# Patient Record
Sex: Female | Born: 1955
Health system: Southern US, Community
[De-identification: ages and names within clinical notes are randomized; demographics above are authoritative.]

## PROBLEM LIST (undated history)

## (undated) DIAGNOSIS — J189 Pneumonia, unspecified organism: Secondary | ICD-10-CM

## (undated) DIAGNOSIS — F32A Depression, unspecified: Secondary | ICD-10-CM

## (undated) DIAGNOSIS — D649 Anemia, unspecified: Secondary | ICD-10-CM

## (undated) DIAGNOSIS — M51369 Other intervertebral disc degeneration, lumbar region without mention of lumbar back pain or lower extremity pain: Secondary | ICD-10-CM

## (undated) DIAGNOSIS — M797 Fibromyalgia: Secondary | ICD-10-CM

## (undated) DIAGNOSIS — J309 Allergic rhinitis, unspecified: Secondary | ICD-10-CM

## (undated) DIAGNOSIS — K219 Gastro-esophageal reflux disease without esophagitis: Secondary | ICD-10-CM

## (undated) DIAGNOSIS — M5136 Other intervertebral disc degeneration, lumbar region: Secondary | ICD-10-CM

## (undated) DIAGNOSIS — F419 Anxiety disorder, unspecified: Secondary | ICD-10-CM

## (undated) DIAGNOSIS — K2960 Other gastritis without bleeding: Secondary | ICD-10-CM

## (undated) DIAGNOSIS — M1990 Unspecified osteoarthritis, unspecified site: Secondary | ICD-10-CM

## (undated) HISTORY — DX: Other gastritis without bleeding: K29.60

## (undated) HISTORY — DX: Anemia, unspecified: D64.9

## (undated) HISTORY — PX: BREAST SURGERY: SHX581

## (undated) HISTORY — PX: TONSILLECTOMY: SHX5217

## (undated) HISTORY — DX: Fibromyalgia: M79.7

## (undated) HISTORY — DX: Other intervertebral disc degeneration, lumbar region without mention of lumbar back pain or lower extremity pain: M51.369

## (undated) HISTORY — PX: TUBAL LIGATION: SHX77

## (undated) HISTORY — PX: KNEE ARTHROSCOPY: SUR90

## (undated) HISTORY — DX: Anxiety disorder, unspecified: F41.9

## (undated) HISTORY — PX: EYE SURGERY: SHX253

## (undated) HISTORY — DX: Unspecified osteoarthritis, unspecified site: M19.90

## (undated) HISTORY — DX: Other intervertebral disc degeneration, lumbar region: M51.36

## (undated) HISTORY — DX: Allergic rhinitis, unspecified: J30.9

---

## 1998-10-03 ENCOUNTER — Ambulatory Visit (HOSPITAL_COMMUNITY): Admission: EM | Admit: 1998-10-03 | Discharge: 1998-10-03 | Payer: Self-pay | Admitting: Emergency Medicine

## 1998-10-03 ENCOUNTER — Encounter: Payer: Self-pay | Admitting: Cardiology

## 1999-10-04 ENCOUNTER — Other Ambulatory Visit: Admission: RE | Admit: 1999-10-04 | Discharge: 1999-10-04 | Payer: Self-pay | Admitting: Obstetrics & Gynecology

## 2000-03-12 ENCOUNTER — Other Ambulatory Visit: Admission: RE | Admit: 2000-03-12 | Discharge: 2000-03-12 | Payer: Self-pay | Admitting: Obstetrics & Gynecology

## 2000-11-03 ENCOUNTER — Encounter: Admission: RE | Admit: 2000-11-03 | Discharge: 2000-11-03 | Payer: Self-pay | Admitting: Otolaryngology

## 2000-11-03 ENCOUNTER — Encounter: Payer: Self-pay | Admitting: Otolaryngology

## 2001-06-24 ENCOUNTER — Other Ambulatory Visit: Admission: RE | Admit: 2001-06-24 | Discharge: 2001-06-24 | Payer: Self-pay | Admitting: Obstetrics & Gynecology

## 2002-12-13 ENCOUNTER — Other Ambulatory Visit: Admission: RE | Admit: 2002-12-13 | Discharge: 2002-12-13 | Payer: Self-pay | Admitting: Obstetrics & Gynecology

## 2003-07-29 ENCOUNTER — Encounter (INDEPENDENT_AMBULATORY_CARE_PROVIDER_SITE_OTHER): Payer: Self-pay | Admitting: Specialist

## 2003-07-29 ENCOUNTER — Ambulatory Visit (HOSPITAL_COMMUNITY): Admission: RE | Admit: 2003-07-29 | Discharge: 2003-07-29 | Payer: Self-pay | Admitting: Obstetrics & Gynecology

## 2004-01-10 ENCOUNTER — Ambulatory Visit: Payer: Self-pay | Admitting: Family Medicine

## 2004-01-31 ENCOUNTER — Other Ambulatory Visit: Admission: RE | Admit: 2004-01-31 | Discharge: 2004-01-31 | Payer: Self-pay | Admitting: Obstetrics & Gynecology

## 2004-02-20 ENCOUNTER — Ambulatory Visit: Payer: Self-pay | Admitting: Family Medicine

## 2004-03-19 ENCOUNTER — Ambulatory Visit: Payer: Self-pay | Admitting: Family Medicine

## 2004-04-02 ENCOUNTER — Ambulatory Visit: Payer: Self-pay | Admitting: Family Medicine

## 2004-05-01 ENCOUNTER — Ambulatory Visit: Payer: Self-pay | Admitting: Family Medicine

## 2004-07-02 ENCOUNTER — Ambulatory Visit: Payer: Self-pay | Admitting: Family Medicine

## 2004-07-19 ENCOUNTER — Ambulatory Visit: Payer: Self-pay | Admitting: Family Medicine

## 2004-09-19 ENCOUNTER — Ambulatory Visit: Payer: Self-pay | Admitting: Family Medicine

## 2005-01-08 ENCOUNTER — Other Ambulatory Visit: Admission: RE | Admit: 2005-01-08 | Discharge: 2005-01-08 | Payer: Self-pay | Admitting: Obstetrics and Gynecology

## 2005-01-22 ENCOUNTER — Ambulatory Visit: Payer: Self-pay | Admitting: Family Medicine

## 2005-04-16 ENCOUNTER — Ambulatory Visit: Payer: Self-pay | Admitting: Family Medicine

## 2005-05-02 ENCOUNTER — Ambulatory Visit: Payer: Self-pay | Admitting: Family Medicine

## 2006-01-10 ENCOUNTER — Other Ambulatory Visit: Admission: RE | Admit: 2006-01-10 | Discharge: 2006-01-10 | Payer: Self-pay | Admitting: Obstetrics and Gynecology

## 2006-02-04 ENCOUNTER — Ambulatory Visit (HOSPITAL_COMMUNITY): Admission: RE | Admit: 2006-02-04 | Discharge: 2006-02-04 | Payer: Self-pay | Admitting: Internal Medicine

## 2008-01-06 ENCOUNTER — Telehealth: Payer: Self-pay | Admitting: Gastroenterology

## 2008-01-13 ENCOUNTER — Ambulatory Visit: Payer: Self-pay | Admitting: Gastroenterology

## 2008-01-20 ENCOUNTER — Encounter: Payer: Self-pay | Admitting: Gastroenterology

## 2008-01-20 ENCOUNTER — Ambulatory Visit: Payer: Self-pay | Admitting: Gastroenterology

## 2008-01-23 ENCOUNTER — Encounter: Payer: Self-pay | Admitting: Gastroenterology

## 2008-10-17 ENCOUNTER — Ambulatory Visit: Admission: RE | Admit: 2008-10-17 | Discharge: 2008-10-17 | Payer: Self-pay | Admitting: Obstetrics and Gynecology

## 2008-10-17 ENCOUNTER — Ambulatory Visit: Payer: Self-pay | Admitting: *Deleted

## 2008-10-17 ENCOUNTER — Encounter (INDEPENDENT_AMBULATORY_CARE_PROVIDER_SITE_OTHER): Payer: Self-pay | Admitting: Obstetrics and Gynecology

## 2009-05-29 ENCOUNTER — Ambulatory Visit: Payer: Self-pay | Admitting: Surgery

## 2010-07-24 NOTE — Assessment & Plan Note (Signed)
OFFICE VISIT   Amy Spence, Amy Spence  DOB:  09-09-1955                                       05/29/2009  EAVWU#:[id number]   REASON FOR VISIT:  Bilateral leg pain.   REFERRING PHYSICIAN:  Dr. Thomasena Edis.   PRIMARY CARE PHYSICIAN:  Dr. Nehemiah Settle.   HISTORY:  This is a 55 year old female seen at request of Dr. Thomasena Edis  for evaluation of bilateral leg pain.  She states that she has pain,  which is a shooting-type pain and some numbness on the anterior parts of  both feet.  There are no aggravating factors.  It can come on at any  time.  The only relieving factor is a hot bath.  It is not associated  with exercise.  She does not have a history of deep vein thrombosis.  She does not endorse any swelling in her legs.   PAST MEDICAL HISTORY:  Fibromyalgia.   PAST SURGICAL HISTORY:  Tubal ligation, right knee scope, and  tonsillectomy.   REVIEW OF SYSTEMS:  GI:  Positive for reflux and constipation.  VASCULAR:  Positive for pain in the legs with walking.  NEURO:  Positive for dizziness.  MUSCULOSKELETAL:  Positive for arthritis, joint pain, muscle pain.  PSYCH:  Positive for depression, anxiety.  All other review of systems  are negative as documented in the encounter form.   FAMILY HISTORY:  Positive for cardiovascular disease at an early age in  her mother and her sister.   SOCIAL HISTORY:  She is married with 3 children.  She is a Futures trader.  Does not smoke, has a history smoking, does not drink alcohol.   PHYSICAL EXAMINATION:  Heart rate 92, blood pressure 123/76, O2 sats are  100%.  General:  She is well-appearing in no distress.  HEENT:  Within  normal limits.  Respirations:  Nonlabored.  Cardiovascular:  Regular  rate and rhythm.  Musculoskeletal is without major deformities.  Neuro:  She has no focal deficits or weakness.  Skin is without rash.  She has  palpable posterior tibial pulses and popliteal pulses bilaterally.  There is no edema.  No  ulceration.   DIAGNOSTIC STUDIES:  Venous ultrasound was performed.  This shows no  evidence of deep or superficial venous thrombosis.  There is venous  reflux exclusively in her proximal greater saphenous vein.  Posterior  tibial artery waveforms are triphasic.   ASSESSMENT:  Bilateral leg pain.   PLAN:  I discussed the ultrasound findings with the patient.  I do not  feel that her symptoms are related to either arterial or venous  insufficiency.  The patient will contact me if she has any further  questions.  She is due to see Dr. Nehemiah Settle next week, and she is going to  see Dr. Thomasena Edis in the near future for a repeat steroid injection.     Jorge Ny, MD  Electronically Signed   VWB/MEDQ  D:  05/29/2009  T:  05/30/2009  Job:  [id number]   cc:   Dr. Deirdre Evener D. Polite, M.D.

## 2010-07-24 NOTE — Procedures (Signed)
DUPLEX DEEP VENOUS EXAM - LOWER EXTREMITY   INDICATION:  Fairly constant lower extremity pain bilaterally for  approximately 4-6 months.   HISTORY:  Edema:  No.  Trauma/Surgery:  No.  Pain:  Bilateral lower extremity at night and during day.  PE:  No.  Previous DVT:  No.  Anticoagulants:  No.  Other:   DUPLEX EXAM:                CFV   SFV   PopV  PTV    GSV                R  L  R  L  R  L  R   L  R  L  Thrombosis    o  o  o  o  o  o  o   o  o  o  Spontaneous   +  +  +  +  +  +  +   +  +  +  Phasic        +  +  +  +  +  +  +   +  +  +  Augmentation  +  +  +  +  +  +  +   +  +  +  Compressible  +  +  +  +  +  +  +   +  +  +  Competent     o  o  +  +  +  +  +   +  p  p   Legend:  + - yes  o - no  p - partial  D - decreased   IMPRESSION:  1. No evidence of deep venous thrombosis or superficial venous      thrombosis in bilateral lower extremities.  2. Bilateral common femoral veins show evidence of venous      insufficiency.  3. Bilateral proximal greater saphenous veins show evidence of reflux      with the rest within normal limits.  4. Incidental note:  Bilateral posterior tibial artery flow is      triphasic.    _____________________________  V. Charlena Cross, MD   AS/MEDQ  D:  05/29/2009  T:  05/29/2009  Job:  [id number]

## 2010-07-27 NOTE — Op Note (Signed)
NAME:  Amy Spence, Amy Spence                        ACCOUNT NO.:  [id number]   MEDICAL RECORD NO.:  [id number]                   PATIENT TYPE:  AMB   LOCATION:  SDC                                  FACILITY:  [facility]   PHYSICIAN:  Freddy Finner, M.D.                DATE OF BIRTH:  April 04, 1955   DATE OF PROCEDURE:  07/29/2003  DATE OF DISCHARGE:                                 OPERATIVE REPORT   PREOPERATIVE DIAGNOSES:  1. Uterine enlargement.  2. Endometrial polyp on sonohysterogram.  3. Menorrhagia.   POSTOPERATIVE DIAGNOSES:  1. Uterine enlargement.  2. Endometrial polyp on sonohysterogram.  3. Menorrhagia.   OPERATION:  Hysteroscopy, dilatation and curettage, resection of polyp, and  Novasure ablation of endometrium.   SURGEON:  Freddy Finner, M.D.   ANESTHESIA:  Managed intravenous sedation and paracervical block.   INTRAOPERATIVE COMPLICATIONS:  None.   SORBITOL DEFICIT:  20 mL.   ESTIMATED BLOOD LOSS:  10 mL.   INTRAOPERATIVE COMPLICATIONS:  The patient is a 55 year old with  menorrhagia, uterine enlargement, and an endometrial polyp identified by  sonohysterogram.  She is now admitted for surgery.   She was admitted on the morning of surgery, brought to the operating room,  and placed under adequate intravenous sedation and placed in a dorsal  lithotomy position in the [place] stirrup system.  Betadine prep of mons,  perineum, and vagina was carried out in the usual fashion.  Sterile drapes  were applied.  Bivalve speculum was introduced.  Cervix was visualized,  grasped with a single-tooth tenaculum on the anterior lip.  Paracervical  block was placed using a total of 10 mL of 1% Xylocaine.  The cervix sounded  4.5 cm, uterus to 10.5 cm.  A 12.5 degree A[facility]I hysteroscope was introduced  after the dilating the cervix to a #23 with [place].  A small polyp could be  identified.  Gentle thorough curettage was carried out with resection of the  polyp, and the specimen was  submitted for histologic examination.  Novasure  device was then applied in the usual fashion.  Uterine width was 4 cm.  The  carbon dioxide test was negative for perforation.  Ablation was carried out  for a total of 85 seconds.  The device was removed.  The patient was taken  to the recovery room in good condition.  She will be given routine  outpatient surgical instructions for follow-up in the office in  approximately one week.                                               Freddy Finner, M.D.    WRN/MEDQ  D:  07/29/2003  T:  07/29/2003  Job:  [id number]

## 2011-02-06 ENCOUNTER — Encounter: Payer: Self-pay | Admitting: Gastroenterology

## 2011-02-21 ENCOUNTER — Encounter: Payer: Self-pay | Admitting: Gastroenterology

## 2011-02-21 ENCOUNTER — Ambulatory Visit (INDEPENDENT_AMBULATORY_CARE_PROVIDER_SITE_OTHER): Payer: Federal, State, Local not specified - PPO | Admitting: Gastroenterology

## 2011-02-21 ENCOUNTER — Other Ambulatory Visit (INDEPENDENT_AMBULATORY_CARE_PROVIDER_SITE_OTHER): Payer: Federal, State, Local not specified - PPO

## 2011-02-21 VITALS — BP 100/68 | HR 76 | Ht 63.0 in | Wt 154.2 lb

## 2011-02-21 DIAGNOSIS — K5900 Constipation, unspecified: Secondary | ICD-10-CM | POA: Insufficient documentation

## 2011-02-21 DIAGNOSIS — R1033 Periumbilical pain: Secondary | ICD-10-CM

## 2011-02-21 LAB — CBC WITH DIFFERENTIAL/PLATELET
Basophils Absolute: 0 10*3/uL (ref 0.0–0.1)
Eosinophils Absolute: 0.1 10*3/uL (ref 0.0–0.7)
Hemoglobin: 13.4 g/dL (ref 12.0–15.0)
Lymphocytes Relative: 38 % (ref 12.0–46.0)
MCHC: 34.1 g/dL (ref 30.0–36.0)
Monocytes Relative: 5.5 % (ref 3.0–12.0)
Neutro Abs: 3.9 10*3/uL (ref 1.4–7.7)
Neutrophils Relative %: 54.7 % (ref 43.0–77.0)
RBC: 4.24 Mil/uL (ref 3.87–5.11)
RDW: 12.5 % (ref 11.5–14.6)

## 2011-02-21 LAB — BASIC METABOLIC PANEL
BUN: 20 mg/dL (ref 6–23)
Calcium: 9.5 mg/dL (ref 8.4–10.5)
GFR: 81.42 mL/min (ref >60.00)
Potassium: 4.3 mEq/L (ref 3.5–5.1)
Sodium: 141 mEq/L (ref 135–145)

## 2011-02-21 LAB — HEPATIC FUNCTION PANEL
ALT: 17 U/L (ref 0–35)
Albumin: 3.9 g/dL (ref 3.5–5.2)
Alkaline Phosphatase: 79 U/L (ref 39–117)
Total Protein: 7 g/dL (ref 6.0–8.3)

## 2011-02-21 MED ORDER — POLYETHYLENE GLYCOL 3350 17 GM/SCOOP PO POWD: ORAL | Status: DC

## 2011-02-21 NOTE — Progress Notes (Signed)
History of Present Illness: This is a 55 year old female who relates worsening problems with chronic constipation for the past few years. She underwent colonoscopy in November 2009 with one polyp removed that was not retrieved. She states her stools are small, hard and pellet-like and difficult to evacuate occasionally. She notes twinges of periumbilical pain. She tried MiraLax once daily for 2-3 weeks which only slightly improved her symptoms. She has had intentional weight loss on Weight Watchers diet. Denies diarrhea, change in stool caliber, melena, hematochezia, nausea, vomiting, dysphagia, reflux symptoms, chest pain.  Review of Systems: Pertinent positive and negative review of systems were noted in the above HPI section. All other review of systems were otherwise negative.  Current Medications, Allergies, Past Medical History, Past Surgical History, Family History and Social History were reviewed in [employer] record.  Physical Exam: General: Well developed , well nourished, no acute distress Head: Normocephalic and atraumatic Eyes:  sclerae anicteric, EOMI Ears: Normal auditory acuity Mouth: No deformity or lesions Neck: Supple, no masses or thyromegaly Lungs: Clear throughout to auscultation Heart: Regular rate and rhythm; no murmurs, rubs or bruits Abdomen: Soft, non tender and non distended. No masses, hepatosplenomegaly or hernias noted. Normal Bowel sounds Rectal: No lesions, hard, pellet-like, brown, Hemoccult negative stool in the vault Musculoskeletal: Symmetrical with no gross deformities  Skin: No lesions on visible extremities Pulses:  Normal pulses noted Extremities: No clubbing, cyanosis, edema or deformities noted Neurological: Alert oriented x 4, grossly nonfocal Cervical Nodes:  No significant cervical adenopathy Inguinal Nodes: No significant inguinal adenopathy Psychological:  Alert and cooperative. Normal mood and affect  Assessment and  Recommendations:  1. Constipation which has slowly worsened over time. Hemoccult-negative stool. Colonoscopy performed 3 years ago with only one small polyp. Obtain blood work. Substantially increase dietary fiber and water intake. MiraLax 2-3 times daily titrated for adequate bowel movement. Consider the addition of a stool softener. Return office visit in one month and if her symptoms have not adequately responded consider further evaluation with colonoscopy.

## 2011-02-21 NOTE — Patient Instructions (Addendum)
Go directly to the basement to have your labs drawn today. Start Miralax 17 grams in 8 oz of water 2-3 x daily. A prescription has been sent to your pharmacy.  High fiber diet and constipation instructions given. Increase your daily fluids.  cc: Renford Dills, MD

## 2011-02-22 ENCOUNTER — Encounter: Payer: Self-pay | Admitting: Gastroenterology

## 2011-02-22 ENCOUNTER — Telehealth: Payer: Self-pay | Admitting: Gastroenterology

## 2011-02-22 NOTE — Telephone Encounter (Signed)
Error

## 2011-02-22 NOTE — Telephone Encounter (Signed)
Patient wants to know what foods she can eat that are gluten free and high in fiber.  She is advised fresh fruits and vegetables.

## 2011-05-29 ENCOUNTER — Encounter: Payer: Self-pay | Admitting: Gastroenterology

## 2011-10-08 ENCOUNTER — Other Ambulatory Visit: Payer: Self-pay | Admitting: Orthopedic Surgery

## 2012-03-25 ENCOUNTER — Encounter: Payer: Self-pay | Admitting: Gastroenterology

## 2012-03-25 ENCOUNTER — Ambulatory Visit (INDEPENDENT_AMBULATORY_CARE_PROVIDER_SITE_OTHER): Payer: Federal, State, Local not specified - PPO | Admitting: Gastroenterology

## 2012-03-25 VITALS — BP 122/60 | HR 88 | Ht 63.0 in | Wt 167.0 lb

## 2012-03-25 DIAGNOSIS — K5900 Constipation, unspecified: Secondary | ICD-10-CM

## 2012-03-25 NOTE — Progress Notes (Signed)
History of Present Illness: This is a 57 year old female who relates ongoing difficulties with constipation. Describes bowel movements every few days that are hard and pellet like occasional lower abdominal discomfort. She states she tried MiraLax recommended after her last office visit with me one year ago however MiraLax was not effective. She is occasionally tried a stool softener which makes her stools softer but is not increase the frequency of bowel movements. She underwent colonoscopy 2009 that was unremarkable.   Current Medications, Allergies, Past Medical History, Past Surgical History, Family History and Social History were reviewed in [employer] record.  Physical Exam: General: Well developed , well nourished, no acute distress Head: Normocephalic and atraumatic Eyes:  sclerae anicteric, EOMI Ears: Normal auditory acuity Mouth: No deformity or lesions Lungs: Clear throughout to auscultation Heart: Regular rate and rhythm; no murmurs, rubs or bruits Abdomen: Soft, non tender and non distended. No masses, hepatosplenomegaly or hernias noted. Normal Bowel sounds Musculoskeletal: Symmetrical with no gross deformities  Pulses:  Normal pulses noted Extremities: No clubbing, cyanosis, edema or deformities noted Neurological: Alert oriented x 4, grossly nonfocal Psychological:  Alert and cooperative. Normal mood and affect  Assessment and Recommendations:  1. Constipation. Increase dietary fiber and water intake. Begin a fiber supplement twice daily and milk of magnesia once or twice daily. If she is having adequate bowel movements she may change the milk of magnesia to once or twice daily as needed. Continue fiber supplements long-term. Contact my office if her constipation is not adequately controlled with this regimen within the next few weeks.

## 2012-03-25 NOTE — Patient Instructions (Addendum)
Start a over the counter fiber supplement such as Benefiber or Citrucel Clear twice daily.  Take Milk of Magnesia 1-2 x daily for constipation.  Call back in 2 weeks if your symptoms have not improved.

## 2012-10-29 ENCOUNTER — Encounter: Payer: Self-pay | Admitting: Gastroenterology

## 2013-04-19 ENCOUNTER — Other Ambulatory Visit: Payer: Self-pay | Admitting: Obstetrics and Gynecology

## 2013-04-19 DIAGNOSIS — R928 Other abnormal and inconclusive findings on diagnostic imaging of breast: Secondary | ICD-10-CM

## 2013-04-28 ENCOUNTER — Other Ambulatory Visit: Payer: Self-pay | Admitting: Obstetrics and Gynecology

## 2013-04-28 ENCOUNTER — Ambulatory Visit
Admission: RE | Admit: 2013-04-28 | Discharge: 2013-04-28 | Disposition: A | Discharge status: Home / Self Care | Payer: Federal, State, Local not specified - PPO | Source: Ambulatory Visit | Attending: Obstetrics and Gynecology | Admitting: Obstetrics and Gynecology

## 2013-04-28 DIAGNOSIS — R928 Other abnormal and inconclusive findings on diagnostic imaging of breast: Secondary | ICD-10-CM

## 2013-04-29 ENCOUNTER — Other Ambulatory Visit: Payer: Self-pay | Admitting: Obstetrics and Gynecology

## 2013-04-29 ENCOUNTER — Ambulatory Visit
Admission: RE | Admit: 2013-04-29 | Discharge: 2013-04-29 | Disposition: A | Discharge status: Home / Self Care | Payer: Federal, State, Local not specified - PPO | Source: Ambulatory Visit | Attending: Obstetrics and Gynecology | Admitting: Obstetrics and Gynecology

## 2013-04-29 DIAGNOSIS — R928 Other abnormal and inconclusive findings on diagnostic imaging of breast: Secondary | ICD-10-CM

## 2013-08-07 ENCOUNTER — Encounter: Payer: Self-pay | Admitting: Gastroenterology

## 2013-11-06 ENCOUNTER — Encounter: Payer: Self-pay | Admitting: Gastroenterology

## 2017-08-09 DIAGNOSIS — R300 Dysuria: Secondary | ICD-10-CM | POA: Diagnosis not present

## 2017-08-09 DIAGNOSIS — N3090 Cystitis, unspecified without hematuria: Secondary | ICD-10-CM | POA: Diagnosis not present

## 2017-08-17 DIAGNOSIS — N3090 Cystitis, unspecified without hematuria: Secondary | ICD-10-CM | POA: Diagnosis not present

## 2017-09-22 DIAGNOSIS — S93422D Sprain of deltoid ligament of left ankle, subsequent encounter: Secondary | ICD-10-CM | POA: Diagnosis not present

## 2017-09-25 DIAGNOSIS — M797 Fibromyalgia: Secondary | ICD-10-CM | POA: Diagnosis not present

## 2017-09-25 DIAGNOSIS — Z79899 Other long term (current) drug therapy: Secondary | ICD-10-CM | POA: Diagnosis not present

## 2017-09-25 DIAGNOSIS — F329 Major depressive disorder, single episode, unspecified: Secondary | ICD-10-CM | POA: Diagnosis not present

## 2017-09-25 DIAGNOSIS — M961 Postlaminectomy syndrome, not elsewhere classified: Secondary | ICD-10-CM | POA: Diagnosis not present

## 2017-09-25 DIAGNOSIS — F411 Generalized anxiety disorder: Secondary | ICD-10-CM | POA: Diagnosis not present

## 2017-09-25 DIAGNOSIS — M47816 Spondylosis without myelopathy or radiculopathy, lumbar region: Secondary | ICD-10-CM | POA: Diagnosis not present

## 2017-09-25 DIAGNOSIS — M5416 Radiculopathy, lumbar region: Secondary | ICD-10-CM | POA: Diagnosis not present

## 2017-09-26 DIAGNOSIS — L659 Nonscarring hair loss, unspecified: Secondary | ICD-10-CM | POA: Diagnosis not present

## 2017-09-26 DIAGNOSIS — F988 Other specified behavioral and emotional disorders with onset usually occurring in childhood and adolescence: Secondary | ICD-10-CM | POA: Diagnosis not present

## 2017-09-26 DIAGNOSIS — M797 Fibromyalgia: Secondary | ICD-10-CM | POA: Diagnosis not present

## 2017-09-26 DIAGNOSIS — F419 Anxiety disorder, unspecified: Secondary | ICD-10-CM | POA: Diagnosis not present

## 2017-09-26 DIAGNOSIS — F322 Major depressive disorder, single episode, severe without psychotic features: Secondary | ICD-10-CM | POA: Diagnosis not present

## 2017-10-13 DIAGNOSIS — Z0110 Encounter for examination of ears and hearing without abnormal findings: Secondary | ICD-10-CM | POA: Diagnosis not present

## 2017-10-13 DIAGNOSIS — J309 Allergic rhinitis, unspecified: Secondary | ICD-10-CM | POA: Diagnosis not present

## 2017-10-13 DIAGNOSIS — H6983 Other specified disorders of Eustachian tube, bilateral: Secondary | ICD-10-CM | POA: Diagnosis not present

## 2017-10-20 DIAGNOSIS — J3089 Other allergic rhinitis: Secondary | ICD-10-CM | POA: Diagnosis not present

## 2017-10-20 DIAGNOSIS — Z6831 Body mass index (BMI) 31.0-31.9, adult: Secondary | ICD-10-CM | POA: Diagnosis not present

## 2017-10-20 DIAGNOSIS — R635 Abnormal weight gain: Secondary | ICD-10-CM | POA: Diagnosis not present

## 2017-10-20 DIAGNOSIS — E669 Obesity, unspecified: Secondary | ICD-10-CM | POA: Diagnosis not present

## 2017-10-21 DIAGNOSIS — K5900 Constipation, unspecified: Secondary | ICD-10-CM | POA: Diagnosis not present

## 2017-10-21 DIAGNOSIS — R5382 Chronic fatigue, unspecified: Secondary | ICD-10-CM | POA: Diagnosis not present

## 2017-10-21 DIAGNOSIS — F988 Other specified behavioral and emotional disorders with onset usually occurring in childhood and adolescence: Secondary | ICD-10-CM | POA: Diagnosis not present

## 2017-10-21 DIAGNOSIS — F322 Major depressive disorder, single episode, severe without psychotic features: Secondary | ICD-10-CM | POA: Diagnosis not present

## 2017-10-21 DIAGNOSIS — L659 Nonscarring hair loss, unspecified: Secondary | ICD-10-CM | POA: Diagnosis not present

## 2017-10-21 DIAGNOSIS — M797 Fibromyalgia: Secondary | ICD-10-CM | POA: Diagnosis not present

## 2017-10-21 DIAGNOSIS — F419 Anxiety disorder, unspecified: Secondary | ICD-10-CM | POA: Diagnosis not present

## 2017-10-22 DIAGNOSIS — M7702 Medial epicondylitis, left elbow: Secondary | ICD-10-CM | POA: Diagnosis not present

## 2017-10-23 DIAGNOSIS — M1990 Unspecified osteoarthritis, unspecified site: Secondary | ICD-10-CM | POA: Diagnosis not present

## 2017-10-23 DIAGNOSIS — F509 Eating disorder, unspecified: Secondary | ICD-10-CM | POA: Diagnosis not present

## 2017-10-23 DIAGNOSIS — F329 Major depressive disorder, single episode, unspecified: Secondary | ICD-10-CM | POA: Diagnosis not present

## 2017-10-23 DIAGNOSIS — E669 Obesity, unspecified: Secondary | ICD-10-CM | POA: Diagnosis not present

## 2017-10-23 DIAGNOSIS — Z6833 Body mass index (BMI) 33.0-33.9, adult: Secondary | ICD-10-CM | POA: Diagnosis not present

## 2017-10-23 DIAGNOSIS — M797 Fibromyalgia: Secondary | ICD-10-CM | POA: Diagnosis not present

## 2017-11-06 DIAGNOSIS — Z6831 Body mass index (BMI) 31.0-31.9, adult: Secondary | ICD-10-CM | POA: Diagnosis not present

## 2017-11-06 DIAGNOSIS — E669 Obesity, unspecified: Secondary | ICD-10-CM | POA: Diagnosis not present

## 2017-11-06 DIAGNOSIS — Z713 Dietary counseling and surveillance: Secondary | ICD-10-CM | POA: Diagnosis not present

## 2017-11-12 DIAGNOSIS — H10413 Chronic giant papillary conjunctivitis, bilateral: Secondary | ICD-10-CM | POA: Diagnosis not present

## 2017-11-13 DIAGNOSIS — E669 Obesity, unspecified: Secondary | ICD-10-CM | POA: Diagnosis not present

## 2017-11-13 DIAGNOSIS — R635 Abnormal weight gain: Secondary | ICD-10-CM | POA: Diagnosis not present

## 2017-11-13 DIAGNOSIS — Z6830 Body mass index (BMI) 30.0-30.9, adult: Secondary | ICD-10-CM | POA: Diagnosis not present

## 2017-11-13 DIAGNOSIS — Z713 Dietary counseling and surveillance: Secondary | ICD-10-CM | POA: Diagnosis not present

## 2017-11-24 DIAGNOSIS — M7702 Medial epicondylitis, left elbow: Secondary | ICD-10-CM | POA: Diagnosis not present

## 2017-11-25 DIAGNOSIS — Z1231 Encounter for screening mammogram for malignant neoplasm of breast: Secondary | ICD-10-CM | POA: Diagnosis not present

## 2017-11-27 DIAGNOSIS — Z6830 Body mass index (BMI) 30.0-30.9, adult: Secondary | ICD-10-CM | POA: Diagnosis not present

## 2017-11-27 DIAGNOSIS — Z713 Dietary counseling and surveillance: Secondary | ICD-10-CM | POA: Diagnosis not present

## 2017-11-27 DIAGNOSIS — E669 Obesity, unspecified: Secondary | ICD-10-CM | POA: Diagnosis not present

## 2017-12-08 DIAGNOSIS — L659 Nonscarring hair loss, unspecified: Secondary | ICD-10-CM | POA: Diagnosis not present

## 2017-12-08 DIAGNOSIS — E663 Overweight: Secondary | ICD-10-CM | POA: Diagnosis not present

## 2017-12-08 DIAGNOSIS — Z6829 Body mass index (BMI) 29.0-29.9, adult: Secondary | ICD-10-CM | POA: Diagnosis not present

## 2017-12-08 DIAGNOSIS — R252 Cramp and spasm: Secondary | ICD-10-CM | POA: Diagnosis not present

## 2017-12-09 DIAGNOSIS — H04123 Dry eye syndrome of bilateral lacrimal glands: Secondary | ICD-10-CM | POA: Diagnosis not present

## 2017-12-10 DIAGNOSIS — M1990 Unspecified osteoarthritis, unspecified site: Secondary | ICD-10-CM | POA: Diagnosis not present

## 2017-12-10 DIAGNOSIS — M7631 Iliotibial band syndrome, right leg: Secondary | ICD-10-CM | POA: Diagnosis not present

## 2017-12-10 DIAGNOSIS — M25551 Pain in right hip: Secondary | ICD-10-CM | POA: Diagnosis not present

## 2017-12-10 DIAGNOSIS — Z87891 Personal history of nicotine dependence: Secondary | ICD-10-CM | POA: Diagnosis not present

## 2017-12-10 DIAGNOSIS — M7061 Trochanteric bursitis, right hip: Secondary | ICD-10-CM | POA: Diagnosis not present

## 2017-12-22 DIAGNOSIS — E663 Overweight: Secondary | ICD-10-CM | POA: Diagnosis not present

## 2017-12-22 DIAGNOSIS — M1611 Unilateral primary osteoarthritis, right hip: Secondary | ICD-10-CM | POA: Diagnosis not present

## 2017-12-22 DIAGNOSIS — M7061 Trochanteric bursitis, right hip: Secondary | ICD-10-CM | POA: Diagnosis not present

## 2017-12-22 DIAGNOSIS — Z6828 Body mass index (BMI) 28.0-28.9, adult: Secondary | ICD-10-CM | POA: Diagnosis not present

## 2017-12-22 DIAGNOSIS — M7631 Iliotibial band syndrome, right leg: Secondary | ICD-10-CM | POA: Diagnosis not present

## 2017-12-22 DIAGNOSIS — Z713 Dietary counseling and surveillance: Secondary | ICD-10-CM | POA: Diagnosis not present

## 2017-12-27 DIAGNOSIS — R600 Localized edema: Secondary | ICD-10-CM | POA: Diagnosis not present

## 2018-01-06 DIAGNOSIS — M7061 Trochanteric bursitis, right hip: Secondary | ICD-10-CM | POA: Diagnosis not present

## 2018-01-06 DIAGNOSIS — M7631 Iliotibial band syndrome, right leg: Secondary | ICD-10-CM | POA: Diagnosis not present

## 2018-01-06 DIAGNOSIS — S76919A Strain of unspecified muscles, fascia and tendons at thigh level, unspecified thigh, initial encounter: Secondary | ICD-10-CM | POA: Diagnosis not present

## 2018-01-06 DIAGNOSIS — M1611 Unilateral primary osteoarthritis, right hip: Secondary | ICD-10-CM | POA: Diagnosis not present

## 2018-01-07 DIAGNOSIS — R635 Abnormal weight gain: Secondary | ICD-10-CM | POA: Diagnosis not present

## 2018-01-07 DIAGNOSIS — E663 Overweight: Secondary | ICD-10-CM | POA: Diagnosis not present

## 2018-01-07 DIAGNOSIS — Z713 Dietary counseling and surveillance: Secondary | ICD-10-CM | POA: Diagnosis not present

## 2018-01-08 DIAGNOSIS — M7631 Iliotibial band syndrome, right leg: Secondary | ICD-10-CM | POA: Diagnosis not present

## 2018-01-08 DIAGNOSIS — M1611 Unilateral primary osteoarthritis, right hip: Secondary | ICD-10-CM | POA: Diagnosis not present

## 2018-01-08 DIAGNOSIS — S76919A Strain of unspecified muscles, fascia and tendons at thigh level, unspecified thigh, initial encounter: Secondary | ICD-10-CM | POA: Diagnosis not present

## 2018-01-08 DIAGNOSIS — M7061 Trochanteric bursitis, right hip: Secondary | ICD-10-CM | POA: Diagnosis not present

## 2018-01-13 DIAGNOSIS — M7061 Trochanteric bursitis, right hip: Secondary | ICD-10-CM | POA: Diagnosis not present

## 2018-01-13 DIAGNOSIS — M7631 Iliotibial band syndrome, right leg: Secondary | ICD-10-CM | POA: Diagnosis not present

## 2018-01-13 DIAGNOSIS — S76919A Strain of unspecified muscles, fascia and tendons at thigh level, unspecified thigh, initial encounter: Secondary | ICD-10-CM | POA: Diagnosis not present

## 2018-01-13 DIAGNOSIS — M1611 Unilateral primary osteoarthritis, right hip: Secondary | ICD-10-CM | POA: Diagnosis not present

## 2018-01-15 DIAGNOSIS — M1611 Unilateral primary osteoarthritis, right hip: Secondary | ICD-10-CM | POA: Diagnosis not present

## 2018-01-15 DIAGNOSIS — M7061 Trochanteric bursitis, right hip: Secondary | ICD-10-CM | POA: Diagnosis not present

## 2018-01-15 DIAGNOSIS — S76919A Strain of unspecified muscles, fascia and tendons at thigh level, unspecified thigh, initial encounter: Secondary | ICD-10-CM | POA: Diagnosis not present

## 2018-01-15 DIAGNOSIS — M7631 Iliotibial band syndrome, right leg: Secondary | ICD-10-CM | POA: Diagnosis not present

## 2018-01-20 DIAGNOSIS — J309 Allergic rhinitis, unspecified: Secondary | ICD-10-CM | POA: Diagnosis not present

## 2018-01-20 DIAGNOSIS — M7631 Iliotibial band syndrome, right leg: Secondary | ICD-10-CM | POA: Diagnosis not present

## 2018-01-20 DIAGNOSIS — S76919A Strain of unspecified muscles, fascia and tendons at thigh level, unspecified thigh, initial encounter: Secondary | ICD-10-CM | POA: Diagnosis not present

## 2018-01-20 DIAGNOSIS — M1611 Unilateral primary osteoarthritis, right hip: Secondary | ICD-10-CM | POA: Diagnosis not present

## 2018-01-20 DIAGNOSIS — J0100 Acute maxillary sinusitis, unspecified: Secondary | ICD-10-CM | POA: Diagnosis not present

## 2018-01-20 DIAGNOSIS — M7061 Trochanteric bursitis, right hip: Secondary | ICD-10-CM | POA: Diagnosis not present

## 2018-01-22 DIAGNOSIS — M7061 Trochanteric bursitis, right hip: Secondary | ICD-10-CM | POA: Diagnosis not present

## 2018-01-22 DIAGNOSIS — M7631 Iliotibial band syndrome, right leg: Secondary | ICD-10-CM | POA: Diagnosis not present

## 2018-01-22 DIAGNOSIS — M1611 Unilateral primary osteoarthritis, right hip: Secondary | ICD-10-CM | POA: Diagnosis not present

## 2018-01-22 DIAGNOSIS — S76919A Strain of unspecified muscles, fascia and tendons at thigh level, unspecified thigh, initial encounter: Secondary | ICD-10-CM | POA: Diagnosis not present

## 2018-01-26 DIAGNOSIS — F3340 Major depressive disorder, recurrent, in remission, unspecified: Secondary | ICD-10-CM | POA: Diagnosis not present

## 2018-01-26 DIAGNOSIS — M797 Fibromyalgia: Secondary | ICD-10-CM | POA: Diagnosis not present

## 2018-01-26 DIAGNOSIS — M159 Polyosteoarthritis, unspecified: Secondary | ICD-10-CM | POA: Diagnosis not present

## 2018-01-26 DIAGNOSIS — F988 Other specified behavioral and emotional disorders with onset usually occurring in childhood and adolescence: Secondary | ICD-10-CM | POA: Diagnosis not present

## 2018-01-26 DIAGNOSIS — L659 Nonscarring hair loss, unspecified: Secondary | ICD-10-CM | POA: Diagnosis not present

## 2018-01-27 DIAGNOSIS — M7631 Iliotibial band syndrome, right leg: Secondary | ICD-10-CM | POA: Diagnosis not present

## 2018-01-27 DIAGNOSIS — M1611 Unilateral primary osteoarthritis, right hip: Secondary | ICD-10-CM | POA: Diagnosis not present

## 2018-01-27 DIAGNOSIS — S76919A Strain of unspecified muscles, fascia and tendons at thigh level, unspecified thigh, initial encounter: Secondary | ICD-10-CM | POA: Diagnosis not present

## 2018-01-27 DIAGNOSIS — M7061 Trochanteric bursitis, right hip: Secondary | ICD-10-CM | POA: Diagnosis not present

## 2018-02-09 DIAGNOSIS — M7061 Trochanteric bursitis, right hip: Secondary | ICD-10-CM | POA: Diagnosis not present

## 2018-02-09 DIAGNOSIS — M7631 Iliotibial band syndrome, right leg: Secondary | ICD-10-CM | POA: Diagnosis not present

## 2018-02-09 DIAGNOSIS — M1611 Unilateral primary osteoarthritis, right hip: Secondary | ICD-10-CM | POA: Diagnosis not present

## 2018-02-09 DIAGNOSIS — S76919A Strain of unspecified muscles, fascia and tendons at thigh level, unspecified thigh, initial encounter: Secondary | ICD-10-CM | POA: Diagnosis not present

## 2018-02-23 DIAGNOSIS — S76919A Strain of unspecified muscles, fascia and tendons at thigh level, unspecified thigh, initial encounter: Secondary | ICD-10-CM | POA: Diagnosis not present

## 2018-02-23 DIAGNOSIS — M7061 Trochanteric bursitis, right hip: Secondary | ICD-10-CM | POA: Diagnosis not present

## 2018-02-23 DIAGNOSIS — M7631 Iliotibial band syndrome, right leg: Secondary | ICD-10-CM | POA: Diagnosis not present

## 2018-02-23 DIAGNOSIS — M1611 Unilateral primary osteoarthritis, right hip: Secondary | ICD-10-CM | POA: Diagnosis not present

## 2018-03-09 DIAGNOSIS — Z0000 Encounter for general adult medical examination without abnormal findings: Secondary | ICD-10-CM | POA: Diagnosis not present

## 2018-03-17 DIAGNOSIS — E663 Overweight: Secondary | ICD-10-CM | POA: Diagnosis not present

## 2018-03-17 DIAGNOSIS — K5909 Other constipation: Secondary | ICD-10-CM | POA: Diagnosis not present

## 2018-03-17 DIAGNOSIS — Z6827 Body mass index (BMI) 27.0-27.9, adult: Secondary | ICD-10-CM | POA: Diagnosis not present

## 2018-03-17 DIAGNOSIS — Z713 Dietary counseling and surveillance: Secondary | ICD-10-CM | POA: Diagnosis not present

## 2018-04-23 DIAGNOSIS — Z01419 Encounter for gynecological examination (general) (routine) without abnormal findings: Secondary | ICD-10-CM | POA: Diagnosis not present

## 2018-04-23 DIAGNOSIS — Z6828 Body mass index (BMI) 28.0-28.9, adult: Secondary | ICD-10-CM | POA: Diagnosis not present

## 2018-04-23 DIAGNOSIS — N952 Postmenopausal atrophic vaginitis: Secondary | ICD-10-CM | POA: Diagnosis not present

## 2018-04-28 DIAGNOSIS — M519 Unspecified thoracic, thoracolumbar and lumbosacral intervertebral disc disorder: Secondary | ICD-10-CM | POA: Diagnosis not present

## 2018-04-28 DIAGNOSIS — F988 Other specified behavioral and emotional disorders with onset usually occurring in childhood and adolescence: Secondary | ICD-10-CM | POA: Diagnosis not present

## 2018-04-28 DIAGNOSIS — Z13220 Encounter for screening for lipoid disorders: Secondary | ICD-10-CM | POA: Diagnosis not present

## 2018-04-28 DIAGNOSIS — L659 Nonscarring hair loss, unspecified: Secondary | ICD-10-CM | POA: Diagnosis not present

## 2018-04-28 DIAGNOSIS — M797 Fibromyalgia: Secondary | ICD-10-CM | POA: Diagnosis not present

## 2018-04-28 DIAGNOSIS — F3340 Major depressive disorder, recurrent, in remission, unspecified: Secondary | ICD-10-CM | POA: Diagnosis not present

## 2018-05-07 DIAGNOSIS — Z713 Dietary counseling and surveillance: Secondary | ICD-10-CM | POA: Diagnosis not present

## 2018-05-07 DIAGNOSIS — Z6827 Body mass index (BMI) 27.0-27.9, adult: Secondary | ICD-10-CM | POA: Diagnosis not present

## 2018-05-07 DIAGNOSIS — E663 Overweight: Secondary | ICD-10-CM | POA: Diagnosis not present

## 2018-05-11 DIAGNOSIS — M25511 Pain in right shoulder: Secondary | ICD-10-CM | POA: Diagnosis not present

## 2018-05-19 DIAGNOSIS — F3340 Major depressive disorder, recurrent, in remission, unspecified: Secondary | ICD-10-CM | POA: Diagnosis not present

## 2018-05-19 DIAGNOSIS — E663 Overweight: Secondary | ICD-10-CM | POA: Diagnosis not present

## 2018-05-19 DIAGNOSIS — Z6826 Body mass index (BMI) 26.0-26.9, adult: Secondary | ICD-10-CM | POA: Diagnosis not present

## 2018-05-19 DIAGNOSIS — K5900 Constipation, unspecified: Secondary | ICD-10-CM | POA: Diagnosis not present

## 2018-05-19 DIAGNOSIS — F419 Anxiety disorder, unspecified: Secondary | ICD-10-CM | POA: Diagnosis not present

## 2018-05-19 DIAGNOSIS — Z8639 Personal history of other endocrine, nutritional and metabolic disease: Secondary | ICD-10-CM | POA: Diagnosis not present

## 2018-06-24 DIAGNOSIS — L820 Inflamed seborrheic keratosis: Secondary | ICD-10-CM | POA: Diagnosis not present

## 2018-06-24 DIAGNOSIS — L821 Other seborrheic keratosis: Secondary | ICD-10-CM | POA: Diagnosis not present

## 2018-06-24 DIAGNOSIS — L578 Other skin changes due to chronic exposure to nonionizing radiation: Secondary | ICD-10-CM | POA: Diagnosis not present

## 2018-06-29 DIAGNOSIS — Z13220 Encounter for screening for lipoid disorders: Secondary | ICD-10-CM | POA: Diagnosis not present

## 2018-06-29 DIAGNOSIS — M797 Fibromyalgia: Secondary | ICD-10-CM | POA: Diagnosis not present

## 2018-07-03 DIAGNOSIS — S46211A Strain of muscle, fascia and tendon of other parts of biceps, right arm, initial encounter: Secondary | ICD-10-CM | POA: Diagnosis not present

## 2018-07-07 DIAGNOSIS — M19011 Primary osteoarthritis, right shoulder: Secondary | ICD-10-CM | POA: Diagnosis not present

## 2018-07-08 DIAGNOSIS — S46211A Strain of muscle, fascia and tendon of other parts of biceps, right arm, initial encounter: Secondary | ICD-10-CM | POA: Diagnosis not present

## 2018-07-23 DIAGNOSIS — S46211D Strain of muscle, fascia and tendon of other parts of biceps, right arm, subsequent encounter: Secondary | ICD-10-CM | POA: Diagnosis not present

## 2018-08-10 DIAGNOSIS — M7541 Impingement syndrome of right shoulder: Secondary | ICD-10-CM | POA: Diagnosis not present

## 2018-08-10 DIAGNOSIS — M25511 Pain in right shoulder: Secondary | ICD-10-CM | POA: Diagnosis not present

## 2018-08-14 DIAGNOSIS — R635 Abnormal weight gain: Secondary | ICD-10-CM | POA: Diagnosis not present

## 2018-08-14 DIAGNOSIS — E782 Mixed hyperlipidemia: Secondary | ICD-10-CM | POA: Diagnosis not present

## 2018-08-14 DIAGNOSIS — Z6827 Body mass index (BMI) 27.0-27.9, adult: Secondary | ICD-10-CM | POA: Diagnosis not present

## 2018-08-14 DIAGNOSIS — F3340 Major depressive disorder, recurrent, in remission, unspecified: Secondary | ICD-10-CM | POA: Diagnosis not present

## 2018-08-18 DIAGNOSIS — M659 Synovitis and tenosynovitis, unspecified: Secondary | ICD-10-CM | POA: Diagnosis not present

## 2018-08-18 DIAGNOSIS — G8918 Other acute postprocedural pain: Secondary | ICD-10-CM | POA: Diagnosis not present

## 2018-08-18 DIAGNOSIS — S43491A Other sprain of right shoulder joint, initial encounter: Secondary | ICD-10-CM | POA: Diagnosis not present

## 2018-08-18 DIAGNOSIS — M94261 Chondromalacia, right knee: Secondary | ICD-10-CM | POA: Diagnosis not present

## 2018-08-18 DIAGNOSIS — M19011 Primary osteoarthritis, right shoulder: Secondary | ICD-10-CM | POA: Diagnosis not present

## 2018-08-18 DIAGNOSIS — Y999 Unspecified external cause status: Secondary | ICD-10-CM | POA: Diagnosis not present

## 2018-08-18 DIAGNOSIS — S43431A Superior glenoid labrum lesion of right shoulder, initial encounter: Secondary | ICD-10-CM | POA: Diagnosis not present

## 2018-08-18 DIAGNOSIS — M7541 Impingement syndrome of right shoulder: Secondary | ICD-10-CM | POA: Diagnosis not present

## 2018-08-18 DIAGNOSIS — M94211 Chondromalacia, right shoulder: Secondary | ICD-10-CM | POA: Diagnosis not present

## 2018-08-18 DIAGNOSIS — X58XXXA Exposure to other specified factors, initial encounter: Secondary | ICD-10-CM | POA: Diagnosis not present

## 2018-08-18 DIAGNOSIS — M75111 Incomplete rotator cuff tear or rupture of right shoulder, not specified as traumatic: Secondary | ICD-10-CM | POA: Diagnosis not present

## 2018-09-29 DIAGNOSIS — E663 Overweight: Secondary | ICD-10-CM | POA: Diagnosis not present

## 2018-09-29 DIAGNOSIS — F3340 Major depressive disorder, recurrent, in remission, unspecified: Secondary | ICD-10-CM | POA: Diagnosis not present

## 2018-09-29 DIAGNOSIS — Z6828 Body mass index (BMI) 28.0-28.9, adult: Secondary | ICD-10-CM | POA: Diagnosis not present

## 2018-09-29 DIAGNOSIS — R632 Polyphagia: Secondary | ICD-10-CM | POA: Diagnosis not present

## 2018-10-27 DIAGNOSIS — F3340 Major depressive disorder, recurrent, in remission, unspecified: Secondary | ICD-10-CM | POA: Diagnosis not present

## 2018-10-27 DIAGNOSIS — M797 Fibromyalgia: Secondary | ICD-10-CM | POA: Diagnosis not present

## 2018-10-27 DIAGNOSIS — F988 Other specified behavioral and emotional disorders with onset usually occurring in childhood and adolescence: Secondary | ICD-10-CM | POA: Diagnosis not present

## 2018-11-13 DIAGNOSIS — H0288B Meibomian gland dysfunction left eye, upper and lower eyelids: Secondary | ICD-10-CM | POA: Diagnosis not present

## 2018-11-13 DIAGNOSIS — H04123 Dry eye syndrome of bilateral lacrimal glands: Secondary | ICD-10-CM | POA: Diagnosis not present

## 2018-11-13 DIAGNOSIS — H0288A Meibomian gland dysfunction right eye, upper and lower eyelids: Secondary | ICD-10-CM | POA: Diagnosis not present

## 2018-11-20 DIAGNOSIS — Z4789 Encounter for other orthopedic aftercare: Secondary | ICD-10-CM | POA: Diagnosis not present

## 2018-11-20 DIAGNOSIS — M19111 Post-traumatic osteoarthritis, right shoulder: Secondary | ICD-10-CM | POA: Diagnosis not present

## 2018-11-26 DIAGNOSIS — H0288B Meibomian gland dysfunction left eye, upper and lower eyelids: Secondary | ICD-10-CM | POA: Diagnosis not present

## 2018-11-26 DIAGNOSIS — H04123 Dry eye syndrome of bilateral lacrimal glands: Secondary | ICD-10-CM | POA: Diagnosis not present

## 2018-11-26 DIAGNOSIS — H0288A Meibomian gland dysfunction right eye, upper and lower eyelids: Secondary | ICD-10-CM | POA: Diagnosis not present

## 2018-11-26 DIAGNOSIS — H10823 Rosacea conjunctivitis, bilateral: Secondary | ICD-10-CM | POA: Diagnosis not present

## 2018-11-27 ENCOUNTER — Encounter (HOSPITAL_COMMUNITY): Payer: Self-pay

## 2018-11-27 NOTE — Patient Instructions (Signed)
DUE TO COVID-19 ONLY ONE VISITOR IS ALLOWED TO COME WITH YOU AND STAY IN THE WAITING ROOM ONLY DURING PRE OP AND PROCEDURE DAY OF SURGERY. THE 1 VISITOR MAY VISIT WITH YOU AFTER SURGERY IN YOUR PRIVATE ROOM DURING VISITING HOURS ONLY!  YOU NEED TO HAVE A COVID 19 TEST ON_______ @_______ , THIS TEST MUST BE DONE BEFORE SURGERY, COME  [address], [place] , [phone].  [facility] ONCE YOUR COVID TEST IS COMPLETED, PLEASE BEGIN THE QUARANTINE INSTRUCTIONS AS OUTLINED IN YOUR HANDOUT.                Amy Spence  11/27/2018   Your procedure is scheduled on: 12-03-18   [facility]  [facility]   Report to  Short stay  at        0530  AM     Call this number if you have problems the morning of surgery [phone]    Remember: NO SOLID FOOD AFTER MIDNIGHT THE NIGHT PRIOR TO SURGERY. NOTHING BY MOUTH EXCEPT CLEAR LIQUIDS UNTIL    0430 am . PLEASE FINISH ENSURE DRINK PER SURGEON ORDER  WHICH NEEDS TO BE COMPLETED AT     0430 am then nothing by mouth.    CLEAR LIQUID DIET   Foods Allowed                                                                     Foods Excluded  Coffee and tea, regular and decaf                             liquids that you cannot  Plain Jell-O any favor except red or purple                                           see through such as: Fruit ices (not with fruit pulp)                                     milk, soups, orange juice  Iced Popsicles                                    All solid food Carbonated beverages, regular and diet                                    Cranberry, grape and apple juices Sports drinks like Gatorade Lightly seasoned clear broth or consume(fat free) Sugar, honey syrup   ____________________________________________________________________   BRUSH YOUR TEETH MORNING OF SURGERY AND RINSE YOUR MOUTH OUT, NO CHEWING GUM CANDY OR MINTS.     Take these medicines the morning of surgery with A  SIP OF WATER: zoloft, omeprazole, gabapentin, flonase, finasteride, zyrtec, wellbutrin, lipitor  You may not have any metal on your body including hair pins and              piercings  Do not wear jewelry, make-up, lotions, powders or perfumes, deodorant             Do not wear nail polish.  Do not shave  48 hours prior to surgery.     Do not bring valuables to the hospital. [place].  Contacts, dentures or bridgework may not be worn into surgery.               Please read over the following fact sheets you were given: _____________________________________________________________________           [facility] - Preparing for Surgery Before surgery, you can play an important role.  Because skin is not sterile, your skin needs to be as free of germs as possible.  You can reduce the number of germs on your skin by washing with CHG (chlorahexidine gluconate) soap before surgery.  CHG is an antiseptic cleaner which kills germs and bonds with the skin to continue killing germs even after washing. Please DO NOT use if you have an allergy to CHG or antibacterial soaps.  If your skin becomes reddened/irritated stop using the CHG and inform your nurse when you arrive at Short Stay. Do not shave (including legs and underarms) for at least 48 hours prior to the first CHG shower.  You may shave your face/neck. Please follow these instructions carefully:  1.  Shower with CHG Soap the night before surgery and the  morning of Surgery.  2.  If you choose to wash your hair, wash your hair first as usual with your  normal  shampoo.  3.  After you shampoo, rinse your hair and body thoroughly to remove the  shampoo.                           4.  Use CHG as you would any other liquid soap.  You can apply chg directly  to the skin and wash                       Gently with a scrungie or clean washcloth.  5.  Apply the CHG Soap to  your body ONLY FROM THE NECK DOWN.   Do not use on face/ open                           Wound or open sores. Avoid contact with eyes, ears mouth and genitals (private parts).                       Wash face,  Genitals (private parts) with your normal soap.             6.  Wash thoroughly, paying special attention to the area where your surgery  will be performed.  7.  Thoroughly rinse your body with warm water from the neck down.  8.  DO NOT shower/wash with your normal soap after using and rinsing off  the CHG Soap.                9.  Pat yourself dry with a clean towel.  10.  Wear clean pajamas.            11.  Place clean sheets on your bed the night of your first shower and do not  sleep with pets. Day of Surgery : Do not apply any lotions/deodorants the morning of surgery.  Please wear clean clothes to the hospital/surgery center.  FAILURE TO FOLLOW THESE INSTRUCTIONS MAY RESULT IN THE CANCELLATION OF YOUR SURGERY PATIENT SIGNATURE_________________________________  NURSE SIGNATURE__________________________________  ________________________________________________________________________   [name]  An incentive spirometer is a tool that can help keep your lungs clear and active. This tool measures how well you are filling your lungs with each breath. Taking long deep breaths may help reverse or decrease the chance of developing breathing (pulmonary) problems (especially infection) following:  A long period of time when you are unable to move or be active. BEFORE THE PROCEDURE   If the spirometer includes an indicator to show your best effort, your nurse or respiratory therapist will set it to a desired goal.  If possible, sit up straight or lean slightly forward. Try not to slouch.  Hold the incentive spirometer in an upright position. INSTRUCTIONS FOR USE  1. Sit on the edge of your bed if possible, or sit up as far as you can in bed or on a  chair. 2. Hold the incentive spirometer in an upright position. 3. Breathe out normally. 4. Place the mouthpiece in your mouth and seal your lips tightly around it. 5. Breathe in slowly and as deeply as possible, raising the piston or the ball toward the top of the column. 6. Hold your breath for 3-5 seconds or for as long as possible. Allow the piston or ball to fall to the bottom of the column. 7. Remove the mouthpiece from your mouth and breathe out normally. 8. Rest for a few seconds and repeat Steps 1 through 7 at least 10 times every 1-2 hours when you are awake. Take your time and take a few normal breaths between deep breaths. 9. The spirometer may include an indicator to show your best effort. Use the indicator as a goal to work toward during each repetition. 10. After each set of 10 deep breaths, practice coughing to be sure your lungs are clear. If you have an incision (the cut made at the time of surgery), support your incision when coughing by placing a pillow or rolled up towels firmly against it. Once you are able to get out of bed, walk around indoors and cough well. You may stop using the incentive spirometer when instructed by your caregiver.  RISKS AND COMPLICATIONS  Take your time so you do not get dizzy or light-headed.  If you are in pain, you may need to take or ask for pain medication before doing incentive spirometry. It is harder to take a deep breath if you are having pain. AFTER USE  Rest and breathe slowly and easily.  It can be helpful to keep track of a log of your progress. Your caregiver can provide you with a simple table to help with this. If you are using the spirometer at home, follow these instructions: [place] IF:   You are having difficultly using the spirometer.  You have trouble using the spirometer as often as instructed.  Your pain medication is not giving enough relief while using the spirometer.  You develop fever of 100.5 F  (38.1 C) or higher. SEEK IMMEDIATE MEDICAL CARE IF:   You cough up bloody  sputum that had not been present before.  You develop fever of 102 F (38.9 C) or greater.  You develop worsening pain at or near the incision site. MAKE SURE YOU:   Understand these instructions.  Will watch your condition.  Will get help right away if you are not doing well or get worse. Document Released: 07/08/2006 Document Revised: 05/20/2011 Document Reviewed: 09/08/2006 [facility] Patient Information 2014 [place], [place].   ________________________________________________________________________

## 2018-11-30 ENCOUNTER — Other Ambulatory Visit: Payer: Self-pay

## 2018-11-30 ENCOUNTER — Other Ambulatory Visit (HOSPITAL_COMMUNITY)
Admission: RE | Admit: 2018-11-30 | Discharge: 2018-11-30 | Disposition: A | Discharge status: Home / Self Care | Payer: MEDICARE | Source: Ambulatory Visit | Attending: Orthopedic Surgery | Admitting: Orthopedic Surgery

## 2018-11-30 ENCOUNTER — Encounter (HOSPITAL_COMMUNITY): Payer: Self-pay

## 2018-11-30 ENCOUNTER — Encounter (HOSPITAL_COMMUNITY)
Admission: RE | Admit: 2018-11-30 | Discharge: 2018-11-30 | Disposition: A | Discharge status: Home / Self Care | Payer: MEDICARE | Source: Ambulatory Visit | Attending: Orthopedic Surgery | Admitting: Orthopedic Surgery

## 2018-11-30 DIAGNOSIS — M75101 Unspecified rotator cuff tear or rupture of right shoulder, not specified as traumatic: Secondary | ICD-10-CM | POA: Insufficient documentation

## 2018-11-30 DIAGNOSIS — Z20828 Contact with and (suspected) exposure to other viral communicable diseases: Secondary | ICD-10-CM | POA: Insufficient documentation

## 2018-11-30 DIAGNOSIS — Z01812 Encounter for preprocedural laboratory examination: Secondary | ICD-10-CM | POA: Insufficient documentation

## 2018-11-30 HISTORY — DX: Pneumonia, unspecified organism: J18.9

## 2018-11-30 HISTORY — DX: Gastro-esophageal reflux disease without esophagitis: K21.9

## 2018-11-30 LAB — CBC
HCT: 43.2 % (ref 36.0–46.0)
Hemoglobin: 13.9 g/dL (ref 12.0–15.0)
MCH: 30.4 pg (ref 26.0–34.0)
MCHC: 32.2 g/dL (ref 30.0–36.0)
MCV: 94.5 fL (ref 80.0–100.0)
Platelets: 276 10*3/uL (ref 150–400)
RBC: 4.57 MIL/uL (ref 3.87–5.11)
RDW: 12.2 % (ref 11.5–15.5)
WBC: 7.2 10*3/uL (ref 4.0–10.5)
nRBC: 0 % (ref 0.0–0.2)

## 2018-11-30 LAB — SURGICAL PCR SCREEN
MRSA, PCR: NEGATIVE
Staphylococcus aureus: NEGATIVE

## 2018-12-02 LAB — NOVEL CORONAVIRUS, NAA (HOSP ORDER, SEND-OUT TO REF LAB; TAT 18-24 HRS): SARS-CoV-2, NAA: NOT DETECTED

## 2018-12-02 NOTE — Anesthesia Preprocedure Evaluation (Addendum)
Anesthesia Evaluation  Patient identified by MRN, date of birth, ID band Patient awake    Reviewed: Allergy & Precautions, NPO status , Patient's Chart, lab work & pertinent test results  Airway Mallampati: I  TM Distance: >3 FB Neck ROM: Full    Dental no notable dental hx.    Pulmonary former smoker,    Pulmonary exam normal breath sounds clear to auscultation       Cardiovascular negative cardio ROS Normal cardiovascular exam Rhythm:Regular Rate:Normal     Neuro/Psych Anxiety  Neuromuscular disease    GI/Hepatic Neg liver ROS, PUD, GERD  Medicated and Controlled,  Endo/Other  negative endocrine ROS  Renal/GU negative Renal ROS     Musculoskeletal  (+) Arthritis , Fibromyalgia -  Abdominal   Peds  Hematology HLD   Anesthesia Other Findings Right shoulder rotator cuff tear arthropathy  Reproductive/Obstetrics                            Anesthesia Physical Anesthesia Plan  ASA: II  Anesthesia Plan: General and Regional   Post-op Pain Management: GA combined w/ Regional for post-op pain   Induction: Intravenous  PONV Risk Score and Plan: 3 and Ondansetron, Dexamethasone, Midazolam and Treatment may vary due to age or medical condition  Airway Management Planned: Oral ETT  Additional Equipment:   Intra-op Plan:   Post-operative Plan: Extubation in OR  Informed Consent: I have reviewed the patients History and Physical, chart, labs and discussed the procedure including the risks, benefits and alternatives for the proposed anesthesia with the patient or authorized representative who has indicated his/her understanding and acceptance.     Dental advisory given  Plan Discussed with: CRNA  Anesthesia Plan Comments:        Anesthesia Quick Evaluation

## 2018-12-03 ENCOUNTER — Other Ambulatory Visit: Payer: Self-pay

## 2018-12-03 ENCOUNTER — Inpatient Hospital Stay (HOSPITAL_COMMUNITY)
Admission: RE | Admit: 2018-12-03 | Discharge: 2018-12-04 | DRG: 483 | Disposition: A | Discharge status: Home / Self Care | Payer: MEDICARE | Attending: Orthopedic Surgery | Admitting: Orthopedic Surgery

## 2018-12-03 ENCOUNTER — Encounter (HOSPITAL_COMMUNITY)
Admission: RE | Disposition: A | Discharge status: Home / Self Care | Payer: Self-pay | Source: Home / Self Care | Attending: Orthopedic Surgery

## 2018-12-03 ENCOUNTER — Encounter (HOSPITAL_COMMUNITY): Payer: Self-pay | Admitting: *Deleted

## 2018-12-03 ENCOUNTER — Inpatient Hospital Stay (HOSPITAL_COMMUNITY): Payer: MEDICARE | Admitting: Physician Assistant

## 2018-12-03 ENCOUNTER — Inpatient Hospital Stay (HOSPITAL_COMMUNITY): Payer: MEDICARE | Admitting: Certified Registered"

## 2018-12-03 DIAGNOSIS — M19111 Post-traumatic osteoarthritis, right shoulder: Secondary | ICD-10-CM | POA: Diagnosis not present

## 2018-12-03 DIAGNOSIS — Z8261 Family history of arthritis: Secondary | ICD-10-CM

## 2018-12-03 DIAGNOSIS — Z803 Family history of malignant neoplasm of breast: Secondary | ICD-10-CM

## 2018-12-03 DIAGNOSIS — M5136 Other intervertebral disc degeneration, lumbar region: Secondary | ICD-10-CM | POA: Diagnosis present

## 2018-12-03 DIAGNOSIS — Z791 Long term (current) use of non-steroidal anti-inflammatories (NSAID): Secondary | ICD-10-CM | POA: Diagnosis not present

## 2018-12-03 DIAGNOSIS — K219 Gastro-esophageal reflux disease without esophagitis: Secondary | ICD-10-CM | POA: Diagnosis not present

## 2018-12-03 DIAGNOSIS — M75101 Unspecified rotator cuff tear or rupture of right shoulder, not specified as traumatic: Secondary | ICD-10-CM | POA: Diagnosis present

## 2018-12-03 DIAGNOSIS — Z96611 Presence of right artificial shoulder joint: Secondary | ICD-10-CM

## 2018-12-03 DIAGNOSIS — Z7951 Long term (current) use of inhaled steroids: Secondary | ICD-10-CM | POA: Diagnosis not present

## 2018-12-03 DIAGNOSIS — M797 Fibromyalgia: Secondary | ICD-10-CM | POA: Diagnosis present

## 2018-12-03 DIAGNOSIS — E785 Hyperlipidemia, unspecified: Secondary | ICD-10-CM | POA: Diagnosis not present

## 2018-12-03 DIAGNOSIS — G8918 Other acute postprocedural pain: Secondary | ICD-10-CM | POA: Diagnosis not present

## 2018-12-03 DIAGNOSIS — Z20828 Contact with and (suspected) exposure to other viral communicable diseases: Secondary | ICD-10-CM | POA: Diagnosis present

## 2018-12-03 HISTORY — PX: REVERSE SHOULDER ARTHROPLASTY: SHX5054

## 2018-12-03 SURGERY — ARTHROPLASTY, SHOULDER, TOTAL, REVERSE
Anesthesia: Regional | Site: Shoulder | Laterality: Right

## 2018-12-03 MED ORDER — METOCLOPRAMIDE HCL 5 MG/ML IJ SOLN: 5.0000 mg | Freq: Three times a day (TID) | INTRAMUSCULAR | Status: DC | PRN

## 2018-12-03 MED ORDER — LACTATED RINGERS IV SOLN
INTRAVENOUS | Status: DC
  Administered 2018-12-03 (×2): via INTRAVENOUS

## 2018-12-03 MED ORDER — VANCOMYCIN HCL 1000 MG IV SOLR
INTRAVENOUS | Status: DC | PRN
  Administered 2018-12-03: 1000 mg

## 2018-12-03 MED ORDER — BISACODYL 5 MG PO TBEC: 5.0000 mg | DELAYED_RELEASE_TABLET | Freq: Every day | ORAL | Status: DC | PRN

## 2018-12-03 MED ORDER — METHOCARBAMOL 1000 MG/10ML IJ SOLN
500.0000 mg | Freq: Four times a day (QID) | INTRAVENOUS | Status: DC | PRN
  Filled 2018-12-03: qty 5

## 2018-12-03 MED ORDER — DOCUSATE SODIUM 100 MG PO CAPS
100.0000 mg | ORAL_CAPSULE | Freq: Two times a day (BID) | ORAL | Status: DC
  Administered 2018-12-03 – 2018-12-04 (×3): 100 mg via ORAL
  Filled 2018-12-03 (×3): qty 1

## 2018-12-03 MED ORDER — PHENOL 1.4 % MT LIQD: 1.0000 | OROMUCOSAL | Status: DC | PRN

## 2018-12-03 MED ORDER — MIDAZOLAM HCL 2 MG/2ML IJ SOLN
INTRAMUSCULAR | Status: DC | PRN
  Administered 2018-12-03 (×2): 1 mg via INTRAVENOUS

## 2018-12-03 MED ORDER — GABAPENTIN 300 MG PO CAPS
300.0000 mg | ORAL_CAPSULE | Freq: Two times a day (BID) | ORAL | Status: DC
  Administered 2018-12-03 – 2018-12-04 (×2): 300 mg via ORAL
  Filled 2018-12-03 (×2): qty 1

## 2018-12-03 MED ORDER — DIPHENHYDRAMINE HCL 12.5 MG/5ML PO ELIX: 12.5000 mg | ORAL_SOLUTION | ORAL | Status: DC | PRN

## 2018-12-03 MED ORDER — BUPIVACAINE HCL (PF) 0.5 % IJ SOLN
INTRAMUSCULAR | Status: DC | PRN
  Administered 2018-12-03: 15 mL via PERINEURAL

## 2018-12-03 MED ORDER — EPHEDRINE 5 MG/ML INJ
INTRAVENOUS | Status: AC
  Filled 2018-12-03: qty 10

## 2018-12-03 MED ORDER — ALUM & MAG HYDROXIDE-SIMETH 200-200-20 MG/5ML PO SUSP: 30.0000 mL | ORAL | Status: DC | PRN

## 2018-12-03 MED ORDER — HYDROMORPHONE HCL 1 MG/ML IJ SOLN: 0.5000 mg | INTRAMUSCULAR | Status: DC | PRN

## 2018-12-03 MED ORDER — OXYCODONE HCL 5 MG/5ML PO SOLN: 5.0000 mg | Freq: Once | ORAL | Status: DC | PRN

## 2018-12-03 MED ORDER — STERILE WATER FOR IRRIGATION IR SOLN
Status: DC | PRN
  Administered 2018-12-03: 2000 mL

## 2018-12-03 MED ORDER — LACTATED RINGERS IV SOLN
INTRAVENOUS | Status: DC
  Administered 2018-12-03: 06:00:00 via INTRAVENOUS

## 2018-12-03 MED ORDER — MIDAZOLAM HCL 2 MG/2ML IJ SOLN
INTRAMUSCULAR | Status: AC
  Filled 2018-12-03: qty 2

## 2018-12-03 MED ORDER — ONDANSETRON HCL 4 MG/2ML IJ SOLN: 4.0000 mg | Freq: Four times a day (QID) | INTRAMUSCULAR | Status: DC | PRN

## 2018-12-03 MED ORDER — VANCOMYCIN HCL 1000 MG IV SOLR
INTRAVENOUS | Status: AC
  Filled 2018-12-03: qty 1000

## 2018-12-03 MED ORDER — TRANEXAMIC ACID-NACL 1000-0.7 MG/100ML-% IV SOLN
INTRAVENOUS | Status: AC
  Filled 2018-12-03: qty 100

## 2018-12-03 MED ORDER — ONDANSETRON HCL 4 MG/2ML IJ SOLN
INTRAMUSCULAR | Status: AC
  Filled 2018-12-03: qty 2

## 2018-12-03 MED ORDER — PROPOFOL 10 MG/ML IV BOLUS
INTRAVENOUS | Status: DC | PRN
  Administered 2018-12-03: 100 mg via INTRAVENOUS

## 2018-12-03 MED ORDER — POLYETHYLENE GLYCOL 3350 17 G PO PACK: 17.0000 g | PACK | Freq: Every day | ORAL | Status: DC | PRN

## 2018-12-03 MED ORDER — ACETAMINOPHEN 500 MG PO TABS
1000.0000 mg | ORAL_TABLET | Freq: Once | ORAL | Status: AC
  Administered 2018-12-03: 06:00:00 1000 mg via ORAL

## 2018-12-03 MED ORDER — SODIUM CHLORIDE 0.9 % IR SOLN
Status: DC | PRN
  Administered 2018-12-03: 1000 mL

## 2018-12-03 MED ORDER — PANTOPRAZOLE SODIUM 40 MG PO TBEC
40.0000 mg | DELAYED_RELEASE_TABLET | Freq: Every day | ORAL | Status: DC
  Administered 2018-12-03 – 2018-12-04 (×2): 40 mg via ORAL
  Filled 2018-12-03 (×2): qty 1

## 2018-12-03 MED ORDER — BUPROPION HCL ER (SR) 150 MG PO TB12
150.0000 mg | ORAL_TABLET | Freq: Two times a day (BID) | ORAL | Status: DC
  Administered 2018-12-03 – 2018-12-04 (×2): 150 mg via ORAL
  Filled 2018-12-03 (×2): qty 1

## 2018-12-03 MED ORDER — KETOROLAC TROMETHAMINE 30 MG/ML IJ SOLN: 30.0000 mg | Freq: Once | INTRAMUSCULAR | Status: DC | PRN

## 2018-12-03 MED ORDER — MENTHOL 3 MG MT LOZG: 1.0000 | LOZENGE | OROMUCOSAL | Status: DC | PRN

## 2018-12-03 MED ORDER — OXYCODONE HCL 5 MG PO TABS: 10.0000 mg | ORAL_TABLET | ORAL | Status: DC | PRN

## 2018-12-03 MED ORDER — OXYCODONE HCL 5 MG PO TABS: 5.0000 mg | ORAL_TABLET | ORAL | Status: DC | PRN

## 2018-12-03 MED ORDER — KETOROLAC TROMETHAMINE 15 MG/ML IJ SOLN
15.0000 mg | Freq: Four times a day (QID) | INTRAMUSCULAR | Status: AC
  Administered 2018-12-03 – 2018-12-04 (×4): 15 mg via INTRAVENOUS
  Filled 2018-12-03 (×4): qty 1

## 2018-12-03 MED ORDER — HYDROXYZINE HCL 10 MG PO TABS
10.0000 mg | ORAL_TABLET | Freq: Three times a day (TID) | ORAL | Status: DC | PRN
  Filled 2018-12-03: qty 2

## 2018-12-03 MED ORDER — TRANEXAMIC ACID-NACL 1000-0.7 MG/100ML-% IV SOLN
1000.0000 mg | INTRAVENOUS | Status: AC
  Administered 2018-12-03: 08:00:00 1000 mg via INTRAVENOUS

## 2018-12-03 MED ORDER — FENTANYL CITRATE (PF) 100 MCG/2ML IJ SOLN
INTRAMUSCULAR | Status: AC
  Filled 2018-12-03: qty 2

## 2018-12-03 MED ORDER — OXYCODONE HCL 5 MG PO TABS: 5.0000 mg | ORAL_TABLET | Freq: Once | ORAL | Status: DC | PRN

## 2018-12-03 MED ORDER — SUGAMMADEX SODIUM 200 MG/2ML IV SOLN
INTRAVENOUS | Status: DC | PRN
  Administered 2018-12-03: 150 mg via INTRAVENOUS

## 2018-12-03 MED ORDER — BUPIVACAINE LIPOSOME 1.3 % IJ SUSP
INTRAMUSCULAR | Status: DC | PRN
  Administered 2018-12-03: 10 mL via PERINEURAL

## 2018-12-03 MED ORDER — DEXAMETHASONE SODIUM PHOSPHATE 10 MG/ML IJ SOLN
INTRAMUSCULAR | Status: DC | PRN
  Administered 2018-12-03: 8 mg via INTRAVENOUS

## 2018-12-03 MED ORDER — SERTRALINE HCL 100 MG PO TABS
200.0000 mg | ORAL_TABLET | Freq: Every day | ORAL | Status: DC
  Administered 2018-12-03: 21:00:00 200 mg via ORAL
  Filled 2018-12-03: qty 2

## 2018-12-03 MED ORDER — ONDANSETRON HCL 4 MG/2ML IJ SOLN
INTRAMUSCULAR | Status: DC | PRN
  Administered 2018-12-03: 4 mg via INTRAVENOUS

## 2018-12-03 MED ORDER — FENTANYL CITRATE (PF) 250 MCG/5ML IJ SOLN
INTRAMUSCULAR | Status: DC | PRN
  Administered 2018-12-03 (×2): 50 ug via INTRAVENOUS

## 2018-12-03 MED ORDER — TEMAZEPAM 15 MG PO CAPS: 15.0000 mg | ORAL_CAPSULE | Freq: Every evening | ORAL | Status: DC | PRN

## 2018-12-03 MED ORDER — ACETAMINOPHEN 500 MG PO TABS
ORAL_TABLET | ORAL | Status: AC
  Administered 2018-12-03: 06:00:00 1000 mg via ORAL
  Filled 2018-12-03: qty 2

## 2018-12-03 MED ORDER — METOCLOPRAMIDE HCL 5 MG PO TABS: 5.0000 mg | ORAL_TABLET | Freq: Three times a day (TID) | ORAL | Status: DC | PRN

## 2018-12-03 MED ORDER — SODIUM CHLORIDE 0.9 % IV SOLN
INTRAVENOUS | Status: DC | PRN
  Administered 2018-12-03: 08:00:00 20 ug/min via INTRAVENOUS

## 2018-12-03 MED ORDER — LIDOCAINE 2% (20 MG/ML) 5 ML SYRINGE
INTRAMUSCULAR | Status: DC | PRN
  Administered 2018-12-03: 40 mg via INTRAVENOUS

## 2018-12-03 MED ORDER — ROCURONIUM BROMIDE 10 MG/ML (PF) SYRINGE
PREFILLED_SYRINGE | INTRAVENOUS | Status: AC
  Filled 2018-12-03: qty 10

## 2018-12-03 MED ORDER — EPHEDRINE SULFATE-NACL 50-0.9 MG/10ML-% IV SOSY
PREFILLED_SYRINGE | INTRAVENOUS | Status: DC | PRN
  Administered 2018-12-03: 10 mg via INTRAVENOUS
  Administered 2018-12-03: 15 mg via INTRAVENOUS
  Administered 2018-12-03: 10 mg via INTRAVENOUS

## 2018-12-03 MED ORDER — PROPOFOL 10 MG/ML IV BOLUS
INTRAVENOUS | Status: AC
  Filled 2018-12-03: qty 20

## 2018-12-03 MED ORDER — ROCURONIUM BROMIDE 10 MG/ML (PF) SYRINGE
PREFILLED_SYRINGE | INTRAVENOUS | Status: DC | PRN
  Administered 2018-12-03: 10 mg via INTRAVENOUS
  Administered 2018-12-03: 50 mg via INTRAVENOUS

## 2018-12-03 MED ORDER — AMPHETAMINE-DEXTROAMPHET ER 10 MG PO CP24
30.0000 mg | ORAL_CAPSULE | Freq: Every day | ORAL | Status: DC
  Filled 2018-12-03: qty 3

## 2018-12-03 MED ORDER — METHOCARBAMOL 500 MG PO TABS: 500.0000 mg | ORAL_TABLET | Freq: Four times a day (QID) | ORAL | Status: DC | PRN

## 2018-12-03 MED ORDER — DEXAMETHASONE SODIUM PHOSPHATE 10 MG/ML IJ SOLN
INTRAMUSCULAR | Status: AC
  Filled 2018-12-03: qty 1

## 2018-12-03 MED ORDER — ONDANSETRON HCL 4 MG PO TABS: 4.0000 mg | ORAL_TABLET | Freq: Four times a day (QID) | ORAL | Status: DC | PRN

## 2018-12-03 MED ORDER — CHLORHEXIDINE GLUCONATE 4 % EX LIQD: 60.0000 mL | Freq: Once | CUTANEOUS | Status: DC

## 2018-12-03 MED ORDER — CEFAZOLIN SODIUM-DEXTROSE 2-4 GM/100ML-% IV SOLN
INTRAVENOUS | Status: AC
  Filled 2018-12-03: qty 100

## 2018-12-03 MED ORDER — CEFAZOLIN SODIUM-DEXTROSE 2-4 GM/100ML-% IV SOLN
2.0000 g | INTRAVENOUS | Status: AC
  Administered 2018-12-03: 08:00:00 2 g via INTRAVENOUS

## 2018-12-03 MED ORDER — ACETAMINOPHEN 325 MG PO TABS: 325.0000 mg | ORAL_TABLET | Freq: Four times a day (QID) | ORAL | Status: DC | PRN

## 2018-12-03 MED ORDER — PROMETHAZINE HCL 25 MG/ML IJ SOLN: 6.2500 mg | INTRAMUSCULAR | Status: DC | PRN

## 2018-12-03 MED ORDER — HYDROMORPHONE HCL 1 MG/ML IJ SOLN: 0.2500 mg | INTRAMUSCULAR | Status: DC | PRN

## 2018-12-03 MED ORDER — SPIRONOLACTONE 25 MG PO TABS
50.0000 mg | ORAL_TABLET | Freq: Every day | ORAL | Status: DC
  Administered 2018-12-04: 50 mg via ORAL
  Filled 2018-12-03 (×2): qty 2

## 2018-12-03 MED ORDER — MAGNESIUM CITRATE PO SOLN: 1.0000 | Freq: Once | ORAL | Status: DC | PRN

## 2018-12-03 MED ORDER — LIDOCAINE 2% (20 MG/ML) 5 ML SYRINGE
INTRAMUSCULAR | Status: AC
  Filled 2018-12-03: qty 5

## 2018-12-03 SURGICAL SUPPLY — 70 items
ADH SKN CLS APL DERMABOND .7 (GAUZE/BANDAGES/DRESSINGS) ×1
AID PSTN UNV HD RSTRNT DISP (MISCELLANEOUS) ×1
BAG SPEC THK2 15X12 ZIP CLS (MISCELLANEOUS) ×1
BAG ZIPLOCK 12X15 (MISCELLANEOUS) ×3 IMPLANT
BLADE SAW SGTL 83.5X18.5 (BLADE) ×3 IMPLANT
BSPLAT GLND +2X24 MDLR (Joint) ×1 IMPLANT
COOLER ICEMAN CLASSIC (MISCELLANEOUS) ×2 IMPLANT
COVER BACK TABLE 60X90IN (DRAPES) ×3 IMPLANT
COVER SURGICAL LIGHT HANDLE (MISCELLANEOUS) ×3 IMPLANT
COVER WAND RF STERILE (DRAPES) ×2 IMPLANT
CUP SUT UNIV REVERS 36 NEUTRAL (Cup) ×2 IMPLANT
DERMABOND ADVANCED (GAUZE/BANDAGES/DRESSINGS) ×2
DERMABOND ADVANCED .7 DNX12 (GAUZE/BANDAGES/DRESSINGS) ×1 IMPLANT
DRAPE INCISE IOBAN 66X45 STRL (DRAPES) IMPLANT
DRAPE ORTHO SPLIT 77X108 STRL (DRAPES) ×3
DRAPE SHEET LG 3/4 BI-LAMINATE (DRAPES) ×5 IMPLANT
DRAPE SURG 17X11 SM STRL (DRAPES) ×3 IMPLANT
DRAPE SURG ORHT 6 SPLT 77X108 (DRAPES) ×2 IMPLANT
DRAPE U-SHAPE 47X51 STRL (DRAPES) ×3 IMPLANT
DRSG AQUACEL AG ADV 3.5X 6 (GAUZE/BANDAGES/DRESSINGS) ×2 IMPLANT
DRSG AQUACEL AG ADV 3.5X10 (GAUZE/BANDAGES/DRESSINGS) ×3 IMPLANT
DURAPREP 26ML APPLICATOR (WOUND CARE) ×3 IMPLANT
ELECT BLADE TIP CTD 4 INCH (ELECTRODE) ×3 IMPLANT
ELECT REM PT RETURN 15FT ADLT (MISCELLANEOUS) ×3 IMPLANT
FACESHIELD WRAPAROUND (MASK) ×12 IMPLANT
FACESHIELD WRAPAROUND OR TEAM (MASK) ×4 IMPLANT
GLENOID UNI REV MOD 24 +2 LAT (Joint) ×2 IMPLANT
GLENOSPHERE 36 +4 LAT/24 (Joint) ×2 IMPLANT
GLOVE BIO SURGEON STRL SZ7.5 (GLOVE) ×1 IMPLANT
GLOVE BIO SURGEON STRL SZ8 (GLOVE) ×1 IMPLANT
GLOVE SS BIOGEL STRL SZ 7 (GLOVE) ×1 IMPLANT
GLOVE SS BIOGEL STRL SZ 7.5 (GLOVE) ×1 IMPLANT
GLOVE SUPERSENSE BIOGEL SZ 7 (GLOVE)
GLOVE SUPERSENSE BIOGEL SZ 7.5 (GLOVE)
GLOVE SURG SYN 8.0 (GLOVE) ×3 IMPLANT
GLOVE SURG SYN 8.0 PF PI (GLOVE) IMPLANT
GOWN STRL REUS W/TWL LRG LVL3 (GOWN DISPOSABLE) ×6 IMPLANT
KIT BASIN OR (CUSTOM PROCEDURE TRAY) ×3 IMPLANT
KIT TURNOVER KIT A (KITS) IMPLANT
LINER HUMERAL 36 +3MM SM (Shoulder) ×2 IMPLANT
MANIFOLD NEPTUNE II (INSTRUMENTS) ×3 IMPLANT
NDL TAPERED W/ NITINOL LOOP (MISCELLANEOUS) ×1 IMPLANT
NEEDLE TAPERED W/ NITINOL LOOP (MISCELLANEOUS) ×3 IMPLANT
NS IRRIG 1000ML POUR BTL (IV SOLUTION) ×3 IMPLANT
PACK SHOULDER (CUSTOM PROCEDURE TRAY) ×3 IMPLANT
PAD ARMBOARD 7.5X6 YLW CONV (MISCELLANEOUS) ×3 IMPLANT
PAD COLD SHLDR WRAP-ON (PAD) ×2 IMPLANT
PIN SET MODULAR GLENOID SYSTEM (PIN) ×2 IMPLANT
RESTRAINT HEAD UNIVERSAL NS (MISCELLANEOUS) ×3 IMPLANT
SCREW CENTRAL MOD 30MM (Screw) ×2 IMPLANT
SCREW PERI LOCK 5.5X16 (Screw) ×4 IMPLANT
SCREW PERI LOCK 5.5X32 (Screw) ×2 IMPLANT
SCREW PERIPHERAL 5.5X20 LOCK (Screw) ×2 IMPLANT
SLING ARM FOAM STRAP LRG (SOFTGOODS) IMPLANT
SLING ARM FOAM STRAP MED (SOFTGOODS) ×2 IMPLANT
SPONGE LAP 18X18 RF (DISPOSABLE) IMPLANT
STEM HUMERAL UNIVER REV SIZE 7 (Stem) ×2 IMPLANT
SUCTION FRAZIER HANDLE 12FR (TUBING) ×2
SUCTION TUBE FRAZIER 12FR DISP (TUBING) ×1 IMPLANT
SUT FIBERWIRE #2 38 T-5 BLUE (SUTURE)
SUT MNCRL AB 3-0 PS2 18 (SUTURE) ×3 IMPLANT
SUT MON AB 2-0 CT1 36 (SUTURE) ×3 IMPLANT
SUT VIC AB 1 CT1 36 (SUTURE) ×3 IMPLANT
SUTURE FIBERWR #2 38 T-5 BLUE (SUTURE) IMPLANT
SUTURE TAPE 1.3 40 TPR END (SUTURE) ×2 IMPLANT
SUTURETAPE 1.3 40 TPR END (SUTURE) ×6
TOWEL OR 17X26 10 PK STRL BLUE (TOWEL DISPOSABLE) ×3 IMPLANT
TOWEL OR NON WOVEN STRL DISP B (DISPOSABLE) ×3 IMPLANT
WATER STERILE IRR 1000ML POUR (IV SOLUTION) ×6 IMPLANT
YANKAUER SUCT BULB TIP 10FT TU (MISCELLANEOUS) ×3 IMPLANT

## 2018-12-03 NOTE — Anesthesia Postprocedure Evaluation (Signed)
Anesthesia Post Note  Patient: CITLALIC NORLANDER  Procedure(s) Performed: REVERSE SHOULDER ARTHROPLASTY (Right Shoulder)     Patient location during evaluation: PACU Anesthesia Type: Regional and General Level of consciousness: awake and alert Pain management: pain level controlled Vital Signs Assessment: post-procedure vital signs reviewed and stable Respiratory status: spontaneous breathing, nonlabored ventilation, respiratory function stable and patient connected to nasal cannula oxygen Cardiovascular status: blood pressure returned to baseline and stable Postop Assessment: no apparent nausea or vomiting Anesthetic complications: no    Last Vitals:  Vitals:   12/03/18 1030 12/03/18 1045  BP: 117/64 105/67  Pulse: 72 72  Resp: 18 17  Temp:    SpO2: 100% 98%    Last Pain:  Vitals:   12/03/18 1045  TempSrc:   PainSc: 0-No pain                 Ryan P Ellender

## 2018-12-03 NOTE — Op Note (Signed)
12/03/2018  9:15 AM  PATIENT:   Amy Spence  63 y.o. female  PRE-OPERATIVE DIAGNOSIS:  Right shoulder rotator cuff tear arthropathy  POST-OPERATIVE DIAGNOSIS: Same  PROCEDURE: Right shoulder reverse arthroplasty utilizing a press-fit size 7 Arthrex stem with a +3 polyethylene insert, 36/+4 glenosphere on a small/+2 baseplate  SURGEON:  Destyn Parfitt, Vania Rea M.D.  ASSISTANTS: Ralene Bathe, PA-C  ANESTHESIA:   General endotracheal and interscalene block with Exparel  EBL: 150 cc  SPECIMEN: None  Drains: None   PATIENT DISPOSITION:  PACU - hemodynamically stable.    PLAN OF CARE: Admit for overnight observation  Brief history:  Patient is a 63 year old female status post a recent rotator cuff repair attempt with continued difficulties with pain and profoundly restricted mobility.  Her recent plain radiographs in the office confirm a high riding humeral head consistent with rotator cuff tear arthropathy.  At the time of this her recent surgery she was noted to have areas of full-thickness chondral loss on the humeral head.  Due to her increasing pain and functional mentation she is brought to the operating this time for planned right shoulder reverse arthroplasty  Preoperatively I did counseled Ms. Gombert regarding treatment options as well as the potential risks versus benefits thereof.  Possible surgical complications were reviewed including bleeding, infection, neurovascular injury, persistent pain, loss of motion, anesthetic complication, failure the implant, and possible need for additional surgery.  She understands, and accepts, and agrees with our planned procedure.  Procedure in detail:  After undergoing routine preop evaluation patient received prophylactic antibiotics and interscalene block with Exparel was established in the holding area by the anesthesia department.  Placed supine on the operating table and underwent the smooth induction of a general endotracheal  anesthesia.  Placed into the beachchair position and appropriately padded and protected.  The right shoulder girdle region was then sterilely prepped and draped in standard fashion.  Timeout was called.  An anterior deltopectoral approach to the right shoulder is made through an approximately 8 cm incision.  Skin flaps elevated dissection carried deeply and the deltopectoral interval was then developed from proximal to distal with the vein taken laterally and electrocautery was used for hemostasis.  The upper centimeter and half of the pectoralis major tendon was tenotomized and the conjoined tendon was then mobilized retracted medially.  She had a previous rupture long head biceps tendon.  The subscapularis was divided from its lesser tuberosity insertion and tagged and reflected medially and the capsular attachments on the anterior and inferior margins of the humeral neck were then divided in a subperiosteal fashion and the humeral head was then delivered through the wound.  The extra medullary guide was then used to outline our humeral head resection which was performed at approximate 25 degrees of retroversion and following this 1 of the previously placed suture anchor was was then removed.  A metal cap was then placed over the cut proximal humeral surface and at this point we then exposed the glenoid with appropriate retractors and performed a circumferential labral resection gaining complete visualization of the periphery of the glenoid.  A guidepin was then directed into the center of the glenoid with an approximately 10 degree inferior tilt and the glenoid was then reamed with our central and peripheral reamers and then the central drill hole was prepared and tapped.  A 30 mm lag screw was then fastened to her baseplate and the baseplate was then inserted with excellent fit and fixation.  The peripheral locking  screws were all then placed H achieving good fixation.  A 36/+4 glenosphere was then impacted onto  the baseplate and the central locking screw was placed with good fixation.  We then returned our attention to the humeral metaphysis where we hand reamed to size 7 and with this we then identified the previously placed lateral Row suture anchors and these were removed in their entirety along with the associated suture material.  We then broached up to a size 7 and then performed metaphyseal reaming with a neutral reaming guide and a trial reduction was then performed showing good soft tissue balance.  Our trial was then removed the final implant was assembled and we did place vancomycin powder into the humeral metaphysis and canal region and then impacted the final size 7 implant with good fixation and trial reduction showed good soft tissue balance with the +3 poly-.  The trial was removed the final poly-was then inserted and a final reduction was then performed and was very pleased with soft tissue balance and stability and motion.  Final irrigation was completed.  Hemostasis was obtained.  The subscapularis mobility was confirmed and it was then repaired back to the collar of the implant utilizing the previously placed suture tapes.  Once this was completed the arm easily achieved 45 degrees of external rotation without excessive tension on repair.  The deltopectoral interval was then reapproximated with a series of figure-of-eight #1 Vicryl sutures.  2-0 Vicryl used for subcu layer and intracuticular 3 Monocryl for the skin followed by Dermabond and Aquasol dressing.  Right arm was placed in a sling and the patient was awakened, extubated, and taken recovery in stable addition.  Jenetta Loges, PA-C was used as an Environmental consultant throughout this case essential for help with positioning the patient, positioning extremity, tissue manipulation, implantation prosthesis, wound closure, and intraoperative decision making.  Metta Clines Landi Biscardi MD   Contact # [phone]

## 2018-12-03 NOTE — Anesthesia Procedure Notes (Signed)
Anesthesia Regional Block: Interscalene brachial plexus block   Pre-Anesthetic Checklist: ,, timeout performed, Correct Patient, Correct Site, Correct Laterality, Correct Procedure, Correct Position, site marked, Risks and benefits discussed,  Surgical consent,  Pre-op evaluation,  At surgeon's request and post-op pain management  Laterality: Right  Prep: chloraprep       Needles:  Injection technique: Single-shot  Needle Type: Echogenic Stimulator Needle     Needle Length: 9cm  Needle Gauge: 21     Additional Needles:   Procedures:,,,, ultrasound used (permanent image in chart),,,,  Narrative:  Start time: 12/03/2018 6:55 AM End time: 12/03/2018 7:05 AM Injection made incrementally with aspirations every 5 mL.  Performed by: Personally  Anesthesiologist: Murvin Natal, MD  Additional Notes: Functioning IV was confirmed and monitors were applied.  A timeout was performed. Sterile prep, hand hygiene and sterile gloves were used. A 32mm 21ga Arrow echogenic stimulator needle was used. Negative aspiration and negative test dose prior to incremental administration of local anesthetic. The patient tolerated the procedure well.  Ultrasound guidance: relevent anatomy identified, needle position confirmed, local anesthetic spread visualized around nerve(s), vascular puncture avoided.  Image printed for medical record.

## 2018-12-03 NOTE — Anesthesia Procedure Notes (Signed)
Procedure Name: Intubation Date/Time: 12/03/2018 7:44 AM Performed by: Eben Burow, CRNA Pre-anesthesia Checklist: Patient identified, Emergency Drugs available, Suction available, Patient being monitored and Timeout performed Patient Re-evaluated:Patient Re-evaluated prior to induction Oxygen Delivery Method: Circle system utilized Preoxygenation: Pre-oxygenation with 100% oxygen Induction Type: IV induction Ventilation: Mask ventilation without difficulty Laryngoscope Size: Mac and 4 Grade View: Grade I Tube type: Oral Tube size: 7.0 mm Number of attempts: 1 Airway Equipment and Method: Stylet Placement Confirmation: ETT inserted through vocal cords under direct vision,  positive ETCO2 and breath sounds checked- equal and bilateral Secured at: 22 cm Tube secured with: Tape Dental Injury: Teeth and Oropharynx as per pre-operative assessment

## 2018-12-03 NOTE — Transfer of Care (Signed)
Immediate Anesthesia Transfer of Care Note  Patient: TAMIAH DYSART  Procedure(s) Performed: REVERSE SHOULDER ARTHROPLASTY (Right Shoulder)  Patient Location: PACU  Anesthesia Type:General  Level of Consciousness: awake, alert  and oriented  Airway & Oxygen Therapy: Patient Spontanous Breathing and Patient connected to face mask oxygen  Post-op Assessment: Report given to RN and Post -op Vital signs reviewed and stable  Post vital signs: Reviewed and stable  Last Vitals:  Vitals Value Taken Time  BP 133/68 12/03/18 0932  Temp    Pulse 82 12/03/18 0933  Resp 17 12/03/18 0933  SpO2 100 % 12/03/18 0933  Vitals shown include unvalidated device data.  Last Pain:  Vitals:   12/03/18 0555  TempSrc: Oral         Complications: No apparent anesthesia complications

## 2018-12-03 NOTE — H&P (Signed)
Amy Spence    Chief Complaint: Right shoulder rotator cuff tear arthropathy HPI: The patient is a 63 y.o. female with progressively increasing right shoulder pain, restricted mobility, and functional limitations related to end stage rotator cuff tear arthropathy.  Past Medical History:  Diagnosis Date  . Anemia   . Anxiety   . Arthritis   . DDD (degenerative disc disease), lumbar   . Erosive gastritis   . Fibromyalgia   . GERD (gastroesophageal reflux disease)   . Pneumonia     Past Surgical History:  Procedure Laterality Date  . BREAST SURGERY     augmentation  . EYE SURGERY     eye lid  . KNEE ARTHROSCOPY    . TONSILLECTOMY    . TUBAL LIGATION      Family History  Problem Relation Age of Onset  . Breast cancer Paternal Aunt   . Irritable bowel syndrome Daughter   . Dementia Father   . Arthritis Mother     Social History:  reports that she has quit smoking. She has never used smokeless tobacco. She reports that she does not drink alcohol or use drugs.   Medications Prior to Admission  Medication Sig Dispense Refill  . acetaminophen (TYLENOL) 650 MG CR tablet Take 1,300 mg by mouth every 8 (eight) hours as needed for pain.    Marland Kitchen amphetamine-dextroamphetamine (ADDERALL XR) 30 MG 24 hr capsule Take 30 mg by mouth every morning.    . Artificial Tear Solution (SOOTHE XP) SOLN Place 1 drop into both eyes 2 (two) times daily as needed (dry eyes).    Marland Kitchen atorvastatin (LIPITOR) 20 MG tablet Take 20 mg by mouth daily.    Marland Kitchen buPROPion (WELLBUTRIN SR) 150 MG 12 hr tablet Take 150 mg by mouth 2 (two) times daily.     . cetirizine (ZYRTEC) 10 MG tablet Take 10 mg by mouth daily.    . cholecalciferol (VITAMIN D3) 25 MCG (1000 UT) tablet Take 1,000 Units by mouth daily.    . Cyanocobalamin (B-12) 5000 MCG SUBL Place 5,000 mcg under the tongue daily.    . finasteride (PROSCAR) 5 MG tablet Take 5 mg by mouth daily. Hair Loss    . fluticasone (FLONASE) 50 MCG/ACT nasal spray Place 1  spray into both nostrils daily.    Marland Kitchen gabapentin (NEURONTIN) 300 MG capsule Take 300 mg by mouth 2 (two) times daily.     . hydrOXYzine (ATARAX/VISTARIL) 10 MG tablet Take 10-20 mg by mouth 3 (three) times daily as needed for anxiety.    . Melatonin 5 MG SUBL Place 10 mg under the tongue at bedtime.    . meloxicam (MOBIC) 15 MG tablet Take 15 mg by mouth daily.    . Multiple Vitamins-Minerals (MULTIVITAMIN WITH MINERALS) tablet Take 1 tablet by mouth daily.    Marland Kitchen NALTREXONE HCL PO Take 4 mg by mouth at bedtime. Compounded med    . omeprazole (PRILOSEC) 40 MG capsule Take 40 mg by mouth daily.    . polyethylene glycol powder (GLYCOLAX/MIRALAX) powder Mix 17 grams in 8 oz of water 2-3 x daily for constipation (Patient taking differently: Take 17 g by mouth daily as needed for moderate constipation. Mix 17 grams in 8 oz of water) 255 g 5  . sertraline (ZOLOFT) 100 MG tablet Take 200 mg by mouth daily.    Marland Kitchen spironolactone (ALDACTONE) 50 MG tablet Take 50 mg by mouth daily.    . vitamin E 400 UNIT capsule Take 400 Units  by mouth daily.       Physical Exam: Right shoulder demonstrates painful and guarded motion with global weakness.  Examination otherwise as noted at her recent office visits.  Recent plain film x-rays demonstrate a high riding humeral head and characteristic changes consistent with a rotator cuff tear arthropathy.  Vitals  Temp:  [97.7 F (36.5 C)] 97.7 F (36.5 C) (09/24 0555) Pulse Rate:  [80] 80 (09/24 0555) Resp:  [18] 18 (09/24 0555) BP: (118)/(73) 118/73 (09/24 0555) SpO2:  [99 %] 99 % (09/24 0555) Weight:  [72.6 kg] 72.6 kg (09/24 0553)  Assessment/Plan  Impression: Right shoulder rotator cuff tear arthropathy  Plan of Action: Procedure(s): REVERSE SHOULDER ARTHROPLASTY  Amy Spence 12/03/2018, 6:24 AM Contact # [phone]

## 2018-12-03 NOTE — Discharge Instructions (Signed)
° °Kevin M. Supple, M.D., F.A.A.O.S. °Orthopaedic Surgery °Specializing in Arthroscopic and Reconstructive °Surgery of the Shoulder °[phone] °[address] - [place], [place] [address] - Fax [phone] ° ° °POST-OP TOTAL SHOULDER REPLACEMENT INSTRUCTIONS ° °1. Call the office at [phone] to schedule your first post-op appointment 10-14 days from the date of your surgery. ° °2. The bandage over your incision is waterproof. You may begin showering with this dressing on. You may leave this dressing on until first follow up appointment within 2 weeks. We prefer you leave this dressing in place until follow up however after 5-7 days if you are having itching or skin irritation and would like to remove it you may do so. Go slow and tug at the borders gently to break the bond the dressing has with the skin. At this point if there is no drainage it is okay to go without a bandage or you may cover it with a light guaze and tape. You can also expect significant bruising around your shoulder that will drift down your arm and into your chest wall. This is very normal and should resolve over several days. ° ° 3. Wear your sling/immobilizer at all times except to perform the exercises below or to occasionally let your arm dangle by your side to stretch your elbow. You also need to sleep in your sling immobilizer until instructed otherwise. It is ok to remove your sling if you are sitting in a controlled environment and allow your arm to rest in a position of comfort by your side or on your lap with pillows to give your neck and skin a break from the sling. You may remove it to allow arm to dangle by side to shower. If you are up walking around and when you go to sleep at night you need to wear it. ° °4. Range of motion to your elbow, wrist, and hand are encouraged 3-5 times daily. Exercise to your hand and fingers helps to reduce swelling you may experience. ° °5. Utilize ice to the shoulder 3-5 times  minimum a day and additionally if you are experiencing pain. ° °6. Prescriptions for a pain medication and a muscle relaxant are provided for you. It is recommended that if you are experiencing pain that you pain medication alone is not controlling, add the muscle relaxant along with the pain medication which can give additional pain relief. The first 1-2 days is generally the most severe of your pain and then should gradually decrease. As your pain lessens it is recommended that you decrease your use of the pain medications to an "as needed basis'" only and to always comply with the recommended dosages of the pain medications. ° °7. Pain medications can produce constipation along with their use. If you experience this, the use of an over the counter stool softener or laxative daily is recommended.  ° °8. For additional questions or concerns, please do not hesitate to call the office. If after hours there is an answering service to forward your concerns to the physician on call. ° °9.Pain control following an exparel block ° °To help control your post-operative pain you received a nerve block  performed with Exparel which is a long acting anesthetic (numbing agent) which can provide pain relief and sensations of numbness (and relief of pain) in the operative shoulder and arm for up to 3 days. Sometimes it provides mixed relief, meaning you may still have numbness in certain areas of the arm but can still   be able to move  parts of that arm, hand, and fingers. We recommend that your prescribed pain medications  be used as needed. We do not feel it is necessary to "pre medicate" and "stay ahead" of pain.  Taking narcotic pain medications when you are not having any pain can lead to unnecessary and potentially dangerous side effects.   ° °10. Use the ice machine as much as possible in the first 5-7 days from surgery, then you can wean its use to as needed. The ice typically needs to be replaced every 6 hours, instead of  ice you can actually freeze water bottles to put in the cooler and then fill water around them to avoid having to purchase ice. You can have spare water bottles freezing to allow you to rotate them once they have melted. Try to have a thin shirt or light cloth or towel under the ice wrap to protect your skin. ° °POST-OP EXERCISES ° °Pendulum Exercises ° °Perform pendulum exercises while standing and bending at the waist. Support your uninvolved arm on a table or chair and allow your operated arm to hang freely. Make sure to do these exercises passively - not using you shoulder muscles. ° °Repeat 20 times. Do 3 sessions per day. ° ° ° ° °

## 2018-12-04 ENCOUNTER — Encounter (HOSPITAL_COMMUNITY): Payer: Self-pay | Admitting: Orthopedic Surgery

## 2018-12-04 MED ORDER — OXYCODONE-ACETAMINOPHEN 5-325 MG PO TABS: 1.0000 | ORAL_TABLET | ORAL | 0 refills | Status: DC | PRN

## 2018-12-04 MED ORDER — CYCLOBENZAPRINE HCL 10 MG PO TABS: 10.0000 mg | ORAL_TABLET | Freq: Three times a day (TID) | ORAL | 1 refills | Status: DC | PRN

## 2018-12-04 NOTE — Discharge Summary (Signed)
PATIENT ID:      Amy Spence  MRN:     [id number] DOB/AGE:    10/08/1955 / 63 y.o.     DISCHARGE SUMMARY  ADMISSION DATE:    12/03/2018 DISCHARGE DATE:    ADMISSION DIAGNOSIS: Right shoulder rotator cuff tear arthropathy Past Medical History:  Diagnosis Date  . Anemia   . Anxiety   . Arthritis   . DDD (degenerative disc disease), lumbar   . Erosive gastritis   . Fibromyalgia   . GERD (gastroesophageal reflux disease)   . Pneumonia     DISCHARGE DIAGNOSIS:   Active Problems:   S/P reverse total shoulder arthroplasty, right   PROCEDURE: Procedure(s): REVERSE SHOULDER ARTHROPLASTY on 12/03/2018  CONSULTS:    HISTORY:  See H&P in chart.  HOSPITAL COURSE:  Amy Spence is a 63 y.o. admitted on 12/03/2018 with a diagnosis of Right shoulder rotator cuff tear arthropathy.  They were brought to the operating room on 12/03/2018 and underwent Procedure(s): [place].    They were given perioperative antibiotics:  Anti-infectives (From admission, onward)   Start     Dose/Rate Route Frequency Ordered Stop   12/03/18 0848  vancomycin (VANCOCIN) powder  Status:  Discontinued       As needed 12/03/18 0848 12/03/18 0929   12/03/18 0600  ceFAZolin (ANCEF) IVPB 2g/100 mL premix     2 g 200 mL/hr over 30 Minutes Intravenous On call to O.R. 12/03/18 0544 12/03/18 0744   12/03/18 0546  ceFAZolin (ANCEF) 2-4 GM/100ML-% IVPB    Note to Pharmacy: Randa Evens  : cabinet override      12/03/18 0546 12/03/18 0744    .  Patient underwent the above named procedure and tolerated it well. The following day they were hemodynamically stable and pain was controlled on oral analgesics. They were neurovascularly intact to the operative extremity. OT was ordered and worked with patient per protocol. They were medically and orthopaedically stable for discharge on .    DIAGNOSTIC STUDIES:  RECENT RADIOGRAPHIC STUDIES :  No results found.  RECENT VITAL SIGNS:   Patient  Vitals for the past 24 hrs:  BP Temp Temp src Pulse Resp SpO2  12/04/18 0520 118/62 97.8 F (36.6 C) - 79 16 97 %  12/04/18 0110 121/69 97.8 F (36.6 C) - 75 16 100 %  12/03/18 2205 (!) 113/58 98 F (36.7 C) - 80 16 100 %  12/03/18 1720 (!) 108/58 98.1 F (36.7 C) - 88 15 97 %  12/03/18 1352 (!) 113/55 98.1 F (36.7 C) Oral 80 - 96 %  12/03/18 1147 110/68 - - 80 18 100 %  12/03/18 1045 105/67 - - 72 17 98 %  12/03/18 1030 117/64 - - 72 18 100 %  12/03/18 1015 123/77 - - 74 16 98 %  12/03/18 1000 120/67 - - 75 17 99 %  12/03/18 0945 116/60 - - 86 19 98 %  12/03/18 0932 133/68 97.6 F (36.4 C) - 83 15 99 %  .  RECENT EKG RESULTS:   No orders found for this or any previous visit.  DISCHARGE INSTRUCTIONS:    DISCHARGE MEDICATIONS:   Allergies as of 12/04/2018      Reactions   Latex       Medication List    TAKE these medications   acetaminophen 650 MG CR tablet Commonly known as: TYLENOL Take 1,300 mg by mouth every 8 (eight) hours as needed for pain.   amphetamine-dextroamphetamine 30  MG 24 hr capsule Commonly known as: ADDERALL XR Take 30 mg by mouth every morning.   atorvastatin 20 MG tablet Commonly known as: LIPITOR Take 20 mg by mouth daily.   B-12 5000 MCG Subl Place 5,000 mcg under the tongue daily.   buPROPion 150 MG 12 hr tablet Commonly known as: WELLBUTRIN SR Take 150 mg by mouth 2 (two) times daily.   cetirizine 10 MG tablet Commonly known as: ZYRTEC Take 10 mg by mouth daily.   cholecalciferol 25 MCG (1000 UT) tablet Commonly known as: VITAMIN D3 Take 1,000 Units by mouth daily.   cyclobenzaprine 10 MG tablet Commonly known as: FLEXERIL Take 1 tablet (10 mg total) by mouth 3 (three) times daily as needed for muscle spasms.   finasteride 5 MG tablet Commonly known as: PROSCAR Take 5 mg by mouth daily. Hair Loss   fluticasone 50 MCG/ACT nasal spray Commonly known as: FLONASE Place 1 spray into both nostrils daily.   gabapentin 300 MG  capsule Commonly known as: NEURONTIN Take 300 mg by mouth 2 (two) times daily.   hydrOXYzine 10 MG tablet Commonly known as: ATARAX/VISTARIL Take 10-20 mg by mouth 3 (three) times daily as needed for anxiety.   Melatonin 5 MG Subl Place 10 mg under the tongue at bedtime.   meloxicam 15 MG tablet Commonly known as: MOBIC Take 15 mg by mouth daily.   multivitamin with minerals tablet Take 1 tablet by mouth daily.   NALTREXONE HCL PO Take 4 mg by mouth at bedtime. Compounded med   omeprazole 40 MG capsule Commonly known as: PRILOSEC Take 40 mg by mouth daily.   oxyCODONE-acetaminophen 5-325 MG tablet Commonly known as: Percocet Take 1 tablet by mouth every 4 (four) hours as needed (max 6 q).   polyethylene glycol powder 17 GM/SCOOP powder Commonly known as: GLYCOLAX/MIRALAX Mix 17 grams in 8 oz of water 2-3 x daily for constipation What changed:   how much to take  how to take this  when to take this  reasons to take this  additional instructions   sertraline 100 MG tablet Commonly known as: ZOLOFT Take 200 mg by mouth daily.   Soothe XP Soln Place 1 drop into both eyes 2 (two) times daily as needed (dry eyes).   spironolactone 50 MG tablet Commonly known as: ALDACTONE Take 50 mg by mouth daily.   vitamin E 400 UNIT capsule Take 400 Units by mouth daily.       FOLLOW UP VISIT:   Follow-up Information    Francena Hanly, MD.   Specialty: Orthopedic Surgery Why: call to be seen in 10-14 days Contact information: [address] [place] [address] [phone]           DISCHARGE TO: Home   DISCHARGE CONDITION:  Amy Spence for Dr. Francena Hanly 12/04/2018, 8:12 AM

## 2018-12-04 NOTE — Evaluation (Signed)
Occupational Therapy Evaluation and Discharge Patient Details Name: Amy Spence MRN: [id number] DOB: [birth year] Today's Date: 12/04/2018    History of Present Illness pt admitted with right shoulder rotator cuff tear arthropathy and now s/p right shoulder reverse arthroplasty   Clinical Impression   This 63 yo female admitted and underwent above presents to acute OT with all education completed, we will D/C from acute OT.    Follow Up Recommendations  No OT follow up    Equipment Recommendations  None recommended by OT       Precautions / Restrictions Precautions Precautions: Shoulder Type of Shoulder Precautions: If sitting in controlled environment, ok to come out of sling to give neck a break. Please sleep in it to protect until follow up in office. OK to use operative arm for feeding, hygiene and ADLs. Ok to instruct Pendulums and lap slides as exercises. Ok to use operative arm within the following parameters for ADL purposes New ROM (8/18) Ok for PROM, AAROM, AROM within pain tolerance and within the following ROM ER 20 ABD 45 FE 60 Shoulder Interventions: Shoulder sling/immobilizer Precaution Booklet Issued: Yes (comment) Required Braces or Orthoses: Sling Restrictions Weight Bearing Restrictions: Yes RUE Weight Bearing: Non weight bearing      Mobility Bed Mobility Overal bed mobility: Independent                Transfers Overall transfer level: Independent                                Vision Patient Visual Report: No change from baseline              Pertinent Vitals/Pain Pain Assessment: No/denies pain(block still partially working)     Hand Dominance Right   Extremity/Trunk Assessment Upper Extremity Assessment Upper Extremity Assessment: RUE deficits/detail RUE Deficits / Details: S/p shoulder sx, elbow to hand WNL RUE Coordination: decreased gross motor           Communication Communication Communication: No  difficulties   Cognition Arousal/Alertness: Awake/alert Behavior During Therapy: WFL for tasks assessed/performed Overall Cognitive Status: Within Functional Limits for tasks assessed                                        Exercises Other Exercises Other Exercises: Pt completed 10 reps of laps slids, all 4 pendulums, elbow-wrist-hand. Other Exercises: Pt also instructed how to doff and don ice sleeve as well as disconnect and connect it  Pt given 4 handouts (post op shoulder, elbow-wrist-hand, pendulums/lapslides and FF 60-ER 20-ABD 45 (for ADLs)   Shoulder Instructions Shoulder Instructions Donning/doffing shirt without moving shoulder: Independent Method for sponge bathing under operated UE: Independent Donning/doffing sling/immobilizer: Minimal assistance Correct positioning of sling/immobilizer: Independent Pendulum exercises (written home exercise program): Independent ROM for elbow, wrist and digits of operated UE: Independent Sling wearing schedule (on at all times/off for ADL's): Independent Proper positioning of operated UE when showering: Independent Dressing change: (NA) Positioning of UE while sleeping: [place] expects to be discharged to:: Private residence Living Arrangements: Spouse/significant other Available Help at Discharge: Family;Available 24 hours/day  OT Problem List: Impaired UE functional use;Decreased strength;Decreased range of motion         OT Goals(Current goals can be found in the care plan section) Acute Rehab OT Goals Patient Stated Goal: to go home today  OT Frequency:                AM-PAC OT "6 Clicks" Daily Activity     Outcome Measure Help from another person eating meals?: None Help from another person taking care of personal grooming?: None Help from another person toileting, which includes using toliet, bedpan, or  urinal?: None Help from another person bathing (including washing, rinsing, drying)?: None Help from another person to put on and taking off regular upper body clothing?: None Help from another person to put on and taking off regular lower body clothing?: None 6 Click Score: 24   End of Session Equipment Utilized During Treatment: (sling) Nurse Communication: (pt ready to go from OT standpoint)  Activity Tolerance: Patient tolerated treatment well Patient left: in chair;with call bell/phone within reach  OT Visit Diagnosis: Other (comment)(impaired UE use)                Time: 0821-0906 OT Time Calculation (min): 45 min Charges:  OT General Charges $OT Visit: 1 Visit OT Evaluation $OT Eval Moderate Complexity: 1 Mod OT Treatments $Self Care/Home Management : 8-22 mins $Therapeutic Exercise: 8-22 mins  Ignacia Palma, OTR/L Acute Rehab Services Pager [phone] Office [phone]     Evette Georges 12/04/2018, 11:17 AM

## 2018-12-16 DIAGNOSIS — Z471 Aftercare following joint replacement surgery: Secondary | ICD-10-CM | POA: Diagnosis not present

## 2018-12-16 DIAGNOSIS — Z96611 Presence of right artificial shoulder joint: Secondary | ICD-10-CM | POA: Diagnosis not present

## 2019-01-05 DIAGNOSIS — H04123 Dry eye syndrome of bilateral lacrimal glands: Secondary | ICD-10-CM | POA: Diagnosis not present

## 2019-01-05 DIAGNOSIS — H10823 Rosacea conjunctivitis, bilateral: Secondary | ICD-10-CM | POA: Diagnosis not present

## 2019-01-05 DIAGNOSIS — H0288A Meibomian gland dysfunction right eye, upper and lower eyelids: Secondary | ICD-10-CM | POA: Diagnosis not present

## 2019-01-05 DIAGNOSIS — H0288B Meibomian gland dysfunction left eye, upper and lower eyelids: Secondary | ICD-10-CM | POA: Diagnosis not present

## 2019-01-29 DIAGNOSIS — Z1231 Encounter for screening mammogram for malignant neoplasm of breast: Secondary | ICD-10-CM | POA: Diagnosis not present

## 2019-02-10 ENCOUNTER — Other Ambulatory Visit: Payer: Self-pay | Admitting: Obstetrics and Gynecology

## 2019-02-10 DIAGNOSIS — T8549XA Other mechanical complication of breast prosthesis and implant, initial encounter: Secondary | ICD-10-CM

## 2019-02-11 ENCOUNTER — Ambulatory Visit
Admission: RE | Admit: 2019-02-11 | Discharge: 2019-02-11 | Disposition: A | Discharge status: Home / Self Care | Payer: MEDICARE | Source: Ambulatory Visit | Attending: Obstetrics and Gynecology | Admitting: Obstetrics and Gynecology

## 2019-02-11 ENCOUNTER — Other Ambulatory Visit: Payer: Self-pay

## 2019-02-11 ENCOUNTER — Other Ambulatory Visit: Payer: Self-pay | Admitting: Obstetrics and Gynecology

## 2019-02-11 DIAGNOSIS — N6323 Unspecified lump in the left breast, lower outer quadrant: Secondary | ICD-10-CM | POA: Diagnosis not present

## 2019-02-11 DIAGNOSIS — N6321 Unspecified lump in the left breast, upper outer quadrant: Secondary | ICD-10-CM | POA: Diagnosis not present

## 2019-02-11 DIAGNOSIS — T8549XA Other mechanical complication of breast prosthesis and implant, initial encounter: Secondary | ICD-10-CM

## 2019-02-17 DIAGNOSIS — Z96611 Presence of right artificial shoulder joint: Secondary | ICD-10-CM | POA: Diagnosis not present

## 2019-02-17 DIAGNOSIS — Z471 Aftercare following joint replacement surgery: Secondary | ICD-10-CM | POA: Diagnosis not present

## 2020-01-05 ENCOUNTER — Ambulatory Visit (INDEPENDENT_AMBULATORY_CARE_PROVIDER_SITE_OTHER): Payer: MEDICARE | Admitting: Psychiatry

## 2020-01-05 ENCOUNTER — Other Ambulatory Visit: Payer: Self-pay

## 2020-01-05 DIAGNOSIS — M797 Fibromyalgia: Secondary | ICD-10-CM

## 2020-01-05 DIAGNOSIS — E678 Other specified hyperalimentation: Secondary | ICD-10-CM

## 2020-01-05 DIAGNOSIS — F411 Generalized anxiety disorder: Secondary | ICD-10-CM

## 2020-01-05 DIAGNOSIS — Z636 Dependent relative needing care at home: Secondary | ICD-10-CM

## 2020-01-05 DIAGNOSIS — Z638 Other specified problems related to primary support group: Secondary | ICD-10-CM
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
## 2020-01-17 ENCOUNTER — Ambulatory Visit (INDEPENDENT_AMBULATORY_CARE_PROVIDER_SITE_OTHER): Payer: MEDICARE | Admitting: Psychiatry

## 2020-01-17 ENCOUNTER — Other Ambulatory Visit: Payer: Self-pay

## 2020-01-17 DIAGNOSIS — M797 Fibromyalgia: Secondary | ICD-10-CM

## 2020-01-17 DIAGNOSIS — Z638 Other specified problems related to primary support group: Secondary | ICD-10-CM

## 2020-01-17 DIAGNOSIS — F411 Generalized anxiety disorder: Secondary | ICD-10-CM

## 2020-01-17 DIAGNOSIS — F410 Panic disorder [episodic paroxysmal anxiety] without agoraphobia: Secondary | ICD-10-CM

## 2020-01-17 DIAGNOSIS — E678 Other specified hyperalimentation: Secondary | ICD-10-CM

## 2020-01-17 DIAGNOSIS — Z636 Dependent relative needing care at home: Secondary | ICD-10-CM
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
## 2020-02-02 ENCOUNTER — Ambulatory Visit (INDEPENDENT_AMBULATORY_CARE_PROVIDER_SITE_OTHER): Payer: MEDICARE | Admitting: Psychiatry

## 2020-02-02 ENCOUNTER — Other Ambulatory Visit: Payer: Self-pay

## 2020-02-02 DIAGNOSIS — Z636 Dependent relative needing care at home: Secondary | ICD-10-CM

## 2020-02-02 DIAGNOSIS — F411 Generalized anxiety disorder: Secondary | ICD-10-CM

## 2020-02-02 DIAGNOSIS — Z638 Other specified problems related to primary support group: Secondary | ICD-10-CM

## 2020-02-02 DIAGNOSIS — M797 Fibromyalgia: Secondary | ICD-10-CM

## 2020-02-02 DIAGNOSIS — F410 Panic disorder [episodic paroxysmal anxiety] without agoraphobia: Secondary | ICD-10-CM

## 2020-02-02 DIAGNOSIS — F5089 Other specified eating disorder: Secondary | ICD-10-CM

## 2020-02-02 NOTE — Progress Notes (Signed)
Psychotherapy Progress Note [facility] [name], PhD LP  Patient ID: Amy Spence     MRN: [id number] Therapy format: Individual psychotherapy Date: 02/02/2020      Start: 2:15p     Stop: 3:05p     Time Spent: 50 min Location: In-person   Session narrative (presenting needs, interim history, self-report of stressors and symptoms, applications of prior therapy, status changes, and interventions made in session) Joyfully looking forward to Thanksgiving gathering at her home this year.  Sister [name] has not pulled mother out of the nursing home or lobbied PT to come up.  Mother adjusting further to nursing home.  Considering when to go up and visit, given that she was told mother would need some adjustment time.  Interested in going, but apprehensive about going and mom being upset.  Reassured that even if she tears up, it's part of getting accustomed to the change, and worth it to show M that family are still available and she's not forgotten.  Still firm about not wanting to see BIL Onalee Hua at holidays or any time.    Medically, warming up to TKR in January.  Has some clearance to do beforehand.  Sedative from PCP is working well, but found herself automatically eating when temporarily alarmed.  Continues to work with [facility] management clinic in [place].    Encouraged continue in behavioral food management program, inquire further as desired about incorporating carb control, as carb chemistry functions as an addiction, and managing that body chemistry is an important factor in managing compulsive eating behavior.  Option to include [facility] for support and strength  Therapeutic modalities: Cognitive Behavioral Therapy and Solution-Oriented/Positive Psychology  Mental Status/Observations:  Appearance:   Casual     Behavior:  Appropriate  Motor:  Normal  Speech/Language:   Clear and Coherent  Affect:  Appropriate  Mood:  normal  Thought process:   normal  Thought content:    WNL  Sensory/Perceptual disturbances:    WNL  Orientation:  Fully oriented  Attention:  Good    Concentration:  Fair  Memory:  WNL  Insight:    Fair  Judgment:   Good  Impulse Control:  Good   Risk Assessment: Danger to Self: No Self-injurious Behavior: No Danger to Others: No Physical Aggression / Violence: No Duty to Warn: No Access to Firearms a concern: No  Assessment of progress:  progressing  Diagnosis:   ICD-10-CM   1. Generalized anxiety disorder with panic attacks  F41.1   2. Relationship problem with family member  Z63.8   3. Compulsive overeating  F50.89   4. Fibromyalgia  M79.7   5. Caregiver stress  Z63.6    Plan:  . OK to go ahead and visit or talk with mother, as able . Boundaries with BIL at discretion . MCT oil still recommended as a substitute for binge foods and delay tactic . Option to [employer] . Other recommendations/advice as may be noted above . Continue to utilize previously learned skills ad lib . Maintain medication as prescribed and work faithfully with relevant prescriber(s) if any changes are desired or seem indicated . Call the clinic on-call service, present to ER, or call 911 if any life-threatening psychiatric crisis Return in about 2 weeks (around 02/16/2020). . Already scheduled visit in this office 02/17/2020.  Robley Fries, PhD [name], PhD LP Clinical Psychologist, [facility] [address], [address] [place], [place] [address] (o)  [phone] 

## 2020-02-17 ENCOUNTER — Other Ambulatory Visit: Payer: Self-pay

## 2020-02-17 ENCOUNTER — Ambulatory Visit (INDEPENDENT_AMBULATORY_CARE_PROVIDER_SITE_OTHER): Payer: MEDICARE | Admitting: Psychiatry

## 2020-02-17 DIAGNOSIS — F411 Generalized anxiety disorder: Secondary | ICD-10-CM

## 2020-02-17 DIAGNOSIS — M797 Fibromyalgia: Secondary | ICD-10-CM

## 2020-02-17 DIAGNOSIS — F410 Panic disorder [episodic paroxysmal anxiety] without agoraphobia: Secondary | ICD-10-CM

## 2020-02-17 DIAGNOSIS — F5089 Other specified eating disorder: Secondary | ICD-10-CM

## 2020-02-17 NOTE — Progress Notes (Signed)
Psychotherapy Progress Note [facility] [name], PhD LP  Patient ID: Amy Spence     MRN: [id number] Therapy format: Individual psychotherapy Date: 02/17/2020      Start: 1:10p     Stop: 2:00p     Time Spent: 50 min Location: In-person   Session narrative (presenting needs, interim history, self-report of stressors and symptoms, applications of prior therapy, status changes, and interventions made in session) Got good opportunity to see mother well cared for, getting happier in her nursing home, enjoying her roommate, and using a walker.  Good visit with [name], no disputes.  Otherwise, feeling out of control with food and weight gain up to where she began in 2019.  Has video visit with [facility] weight management doctor tomorrow.  Says any anxiety or stress punches up her impulse to eat.  Did get MCT oil but tried to use it for anxiety instead of low energy, ineffective.  Evenings tend to be more compulsive.  Tired of Healthy Choice meals and weary of H not going along with her plan, requiring her to prepare double.  PCP some concern she could turn diabetic.  Notes some anxiety 2 days a week taking care of 2-4 grandchildren, including an anxious 9yo adopted 2 years now.    Discussed main points of carb addiction and recommendations (below).  Therapeutic modalities: Cognitive Behavioral Therapy, Solution-Oriented/Positive Psychology and Psycho-education/Bibliotherapy  Mental Status/Observations:  Appearance:   Casual     Behavior:  Appropriate  Motor:  Normal  Speech/Language:   Clear and Coherent  Affect:  Appropriate  Mood:  anxious and dysthymic  Thought process:  tangential  Thought content:    WNL and worry  Sensory/Perceptual disturbances:    WNL  Orientation:  Fully oriented  Attention:  Good    Concentration:  Fair  Memory:  WNL  Insight:    Good  Judgment:   Fair  Impulse Control:  Fair   Risk Assessment: Danger to Self: No Self-injurious  Behavior: No Danger to Others: No Physical Aggression / Violence: No Duty to Warn: No Access to Firearms a concern: No  Assessment of progress:  progressing  Diagnosis:   ICD-10-CM   1. Generalized anxiety disorder with panic attacks  F41.1    F41.0   2. Compulsive overeating  F50.89   3. Fibromyalgia  M79.7    Plan:  . For anxiety handling the boys: o Don't have to explain much or justify saying no to snack runs -- just declare special things happen at special times . For control of weight and compulsive eating: o OK to pick back up on Weight Watchers points plan -- familiar, effective, and more humane than simple calorie-counting o Reallocate calories away from carbs toward oils/fats, wherever tolerable o Time carb consumption -- after non-carb foods where possible, shorter period of the day where possible, further from bedtime  o Put up with temporary ill feelings -- it takes getting past a couple cycles of carb indulgence for the emotional/motivational brain to calm o Have handy ways to spend a little energy rather than consume a little energy, to push metabolism in her favor o Replace sugar and starch oriented snacks with other things -- carrots and guac, puffed grain with nut butter, a little chocolate, and MCT o Where possible, keep temptation foods out of the house; if H insists on bringing them in and eating off plan, be ready to go it alone o Be willing to hand off H's food prep  to H if can't reconcile what they eat o Notice negative self-talk, feelings of disgust and use them as motivation to do the next thing that will make a good difference, not dwell o Notice tendency to agonize over hard things and  just do an amount -- less self-talk about "being" (fat, failure, unable) and more about "doing" (for now I can ___) . Other recommendations/advice as may be noted above . Continue to utilize previously learned skills ad lib . Maintain medication as prescribed and work  faithfully with relevant prescriber(s) if any changes are desired or seem indicated . Call the clinic on-call service, present to ER, or call 911 if any life-threatening psychiatric crisis Return in about 2 weeks (around 03/02/2020) for session(s) already scheduled. . Already scheduled visit in this office 02/28/2020.  Robley Fries, PhD [name], PhD LP Clinical Psychologist, [facility] [address], [address] [place], [place] [address] [phone]

## 2020-02-28 ENCOUNTER — Other Ambulatory Visit: Payer: Self-pay

## 2020-02-28 ENCOUNTER — Ambulatory Visit (INDEPENDENT_AMBULATORY_CARE_PROVIDER_SITE_OTHER): Payer: MEDICARE | Admitting: Psychiatry

## 2020-02-28 DIAGNOSIS — F411 Generalized anxiety disorder: Secondary | ICD-10-CM

## 2020-02-28 DIAGNOSIS — F5089 Other specified eating disorder: Secondary | ICD-10-CM

## 2020-02-28 DIAGNOSIS — F341 Dysthymic disorder: Secondary | ICD-10-CM

## 2020-02-28 DIAGNOSIS — Z638 Other specified problems related to primary support group: Secondary | ICD-10-CM

## 2020-02-28 DIAGNOSIS — M797 Fibromyalgia: Secondary | ICD-10-CM

## 2020-02-28 DIAGNOSIS — F909 Attention-deficit hyperactivity disorder, unspecified type: Secondary | ICD-10-CM

## 2020-02-28 DIAGNOSIS — F410 Panic disorder [episodic paroxysmal anxiety] without agoraphobia: Secondary | ICD-10-CM

## 2020-02-28 NOTE — Progress Notes (Signed)
Psychotherapy Progress Note [facility] [name], PhD LP  Patient ID: RICCI PAFF     MRN: [id number] Therapy format: Individual psychotherapy Date: 02/28/2020      Start: 2:15p     Stop: 3:05p     Time Spent: 50 min Location: In-person   Session narrative (presenting needs, interim history, self-report of stressors and symptoms, applications of prior therapy, status changes, and interventions made in session) Compulsive eating continues after a couple good days.  Knee inflamed.  Baking season puts a lot of temptation at hand, and anxiety about the prospect of knee surgery helps power the desire.  Hx of 2 surgeries on shoulder, apprehensive about pain, experience of a cold application, doesn't exactly trust word she has to be fully accurate and her course to be uncomplicated.  Other focus on why she's down on herself, asks why but then notes how she has had ADHD from before it was recognized, got criticized as lazy.  One teacher bullied her, acc. To a former Veterinary surgeon.  At 9th grade, got laughed at by the class,   Advocated for self-talk purposes seeking to talk to herself the way she would her daughter or another loved one.  Shared a testimonial about forgiveness, rooted in faith experience.   For carbs and weight issues, educated on the notion of "exercise snacks".  Obviously limited by orthopedic issues, but can do large movement with upper body where knee is not up to it.    Therapeutic modalities: Cognitive Behavioral Therapy, Solution-Oriented/Positive Psychology and Psycho-education/Bibliotherapy  Mental Status/Observations:  Appearance:   Casual     Behavior:  Appropriate  Motor:  Normal  Speech/Language:   Clear and Coherent  Affect:  Appropriate  Mood:  anxious  Thought process:  normal  Thought content:    WNL  Sensory/Perceptual disturbances:    WNL  Orientation:  Fully oriented  Attention:  Good    Concentration:  Fair  Memory:  WNL   Insight:    Fair  Judgment:   Good  Impulse Control:  Fair   Risk Assessment: Danger to Self: No Self-injurious Behavior: No Danger to Others: No Physical Aggression / Violence: No Duty to Warn: No Access to Firearms a concern: No  Assessment of progress:  progressing  Diagnosis:   ICD-10-CM   1. Generalized anxiety disorder with panic attacks  F41.1    F41.0   2. Compulsive overeating  F50.89   3. Dysthymia  F34.1   4. Relationship problem with family member  Z63.8   5. Attention deficit hyperactivity disorder (ADHD), unspecified ADHD type -- by hx  F90.9   6. Fibromyalgia  M79.7    Plan:  . Continue trying to apply carb control principles esp. try to find good fats and put spaces between carbs.  Add exercise "snacks" as able. . Practice trust of surgical team including visualization of successful procedure and tolerable aftereffects.  Ask questions ahead of time what to expect, as moved. . Continue practicing positive boundaries with family members as needed and seek to enjoy the experience of loved ones before turning attention to food . Other recommendations/advice as may be noted above . Continue to utilize previously learned skills ad lib . Maintain medication as prescribed and work faithfully with relevant prescriber(s) if any changes are desired or seem indicated . Call the clinic on-call service, present to ER, or call 911 if any life-threatening psychiatric crisis Return for time as available. . Already scheduled visit in this office  03/14/2020.  Robley Fries, PhD [name], PhD LP Clinical Psychologist, [facility] [address] [place], [place] [address] [phone]

## 2020-03-14 ENCOUNTER — Ambulatory Visit: Payer: MEDICARE | Admitting: Psychiatry

## 2020-04-03 ENCOUNTER — Ambulatory Visit: Payer: MEDICARE | Admitting: Psychiatry

## 2020-04-12 NOTE — Patient Instructions (Addendum)
DUE TO COVID-19 ONLY ONE VISITOR IS ALLOWED TO COME WITH YOU AND STAY IN THE WAITING ROOM ONLY DURING PRE OP AND PROCEDURE DAY OF SURGERY. THE 1 VISITOR  MAY VISIT WITH YOU AFTER SURGERY IN YOUR PRIVATE ROOM DURING VISITING HOURS ONLY!  YOU NEED TO HAVE A COVID 19 TEST ON: 04/21/20 @2 :50 PM, THIS TEST MUST BE DONE BEFORE SURGERY,  COVID TESTING SITE [address] [place] Amy Spence , IT IS ON THE RIGHT GOING OUT [address] APPROXIMATELY  2 MINUTES PAST [employer] ON THE RIGHT. ONCE YOUR COVID TEST IS COMPLETED,  PLEASE BEGIN THE QUARANTINE INSTRUCTIONS AS OUTLINED IN YOUR HANDOUT.                Amy Spence    Your procedure is scheduled on: 04/25/20   Report to [facility] Main  Entrance   Report to admitting at: 7:30 AM     Call this number if you have problems the morning of surgery [phone]    Remember:  NO SOLID FOOD AFTER MIDNIGHT THE NIGHT PRIOR TO SURGERY. NOTHING BY MOUTH EXCEPT CLEAR LIQUIDS UNTIL: 7:00 AM . PLEASE FINISH ENSURE DRINK PER SURGEON ORDER  WHICH NEEDS TO BE COMPLETED AT: 7:00 AM .  CLEAR LIQUID DIET  Foods Allowed                                                                     Foods Excluded  Coffee and tea, regular and decaf                             liquids that you cannot  Plain Jell-O any favor except red or purple                                           see through such as: Fruit ices (not with fruit pulp)                                     milk, soups, orange juice  Iced Popsicles                                    All solid food Carbonated beverages, regular and diet                                    Cranberry, grape and apple juices Sports drinks like Gatorade Lightly seasoned clear broth or consume(fat free) Sugar, honey syrup  Sample Menu Breakfast                                Lunch  Supper Cranberry juice                    Beef broth                             Chicken broth Jell-O                                     Grape juice                           Apple juice Coffee or tea                        Jell-O                                      Popsicle                                                Coffee or tea                        Coffee or tea  _____________________________________________________________________   BRUSH YOUR TEETH MORNING OF SURGERY AND RINSE YOUR MOUTH OUT, NO CHEWING GUM CANDY OR MINTS.    Take these medicines the morning of surgery with A SIP OF WATER: aripiprazole,finasteride,gabapentin,sertraline.Use Flonase as usual.                               You may not have any metal on your body including hair pins and              piercings  Do not wear jewelry, make-up, lotions, powders or perfumes, deodorant             Do not wear nail polish on your fingernails.  Do not shave  48 hours prior to surgery.    Do not bring valuables to the hospital. [facility] IS NOT             RESPONSIBLE   FOR VALUABLES.  Contacts, dentures or bridgework may not be worn into surgery.  Leave suitcase in the car. After surgery it may be brought to your room.     Patients discharged the day of surgery will not be allowed to drive home. IF YOU ARE HAVING SURGERY AND GOING HOME THE SAME DAY, YOU MUST HAVE AN ADULT TO DRIVE YOU HOME AND BE WITH YOU FOR 24 HOURS. YOU MAY GO HOME BY TAXI OR UBER OR ORTHERWISE, BUT AN ADULT MUST ACCOMPANY YOU HOME AND STAY WITH YOU FOR 24 HOURS.  Name and phone number of your driver:  Special Instructions: N/A              Please read over the following fact sheets you were given: _____________________________________________________________________        [name] - Preparing for Surgery Before surgery, you can play an important role.  Because skin is not sterile, your skin needs to be as free of germs as possible.  You can reduce the number of germs on your skin by washing with CHG (chlorahexidine  gluconate) soap before surgery.  CHG is an antiseptic cleaner which kills germs and bonds with the skin to continue killing germs even after washing. Please DO NOT use if you have an allergy to CHG or antibacterial soaps.  If your skin becomes reddened/irritated stop using the CHG and inform your nurse when you arrive at Short Stay. Do not shave (including legs and underarms) for at least 48 hours prior to the first CHG shower.  You may shave your face/neck. Please follow these instructions carefully:  1.  Shower with CHG Soap the night before surgery and the  morning of Surgery.  2.  If you choose to wash your hair, wash your hair first as usual with your  normal  shampoo.  3.  After you shampoo, rinse your hair and body thoroughly to remove the  shampoo.                           4.  Use CHG as you would any other liquid soap.  You can apply chg directly  to the skin and wash                       Gently with a scrungie or clean washcloth.  5.  Apply the CHG Soap to your body ONLY FROM THE NECK DOWN.   Do not use on face/ open                           Wound or open sores. Avoid contact with eyes, ears mouth and genitals (private parts).                       Wash face,  Genitals (private parts) with your normal soap.             6.  Wash thoroughly, paying special attention to the area where your surgery  will be performed.  7.  Thoroughly rinse your body with warm water from the neck down.  8.  DO NOT shower/wash with your normal soap after using and rinsing off  the CHG Soap.                9.  Pat yourself dry with a clean towel.            10.  Wear clean pajamas.            11.  Place clean sheets on your bed the night of your first shower and do not  sleep with pets. Day of Surgery : Do not apply any lotions/deodorants the morning of surgery.  Please wear clean clothes to the hospital/surgery center.  FAILURE TO FOLLOW THESE INSTRUCTIONS MAY RESULT IN THE CANCELLATION OF YOUR  SURGERY PATIENT SIGNATURE_________________________________  NURSE SIGNATURE__________________________________  ________________________________________________________________________   Amy Spence  An incentive spirometer is a tool that can help keep your lungs clear and active. This tool measures how well you are filling your lungs with each breath. Taking long deep breaths may help reverse or decrease the chance of developing breathing (pulmonary) problems (especially infection) following:  A long period of time when you are unable to move or be active. BEFORE THE PROCEDURE   If the spirometer includes an indicator to show your best effort, your nurse or respiratory therapist will  set it to a desired goal.  If possible, sit up straight or lean slightly forward. Try not to slouch.  Hold the incentive spirometer in an upright position. INSTRUCTIONS FOR USE  1. Sit on the edge of your bed if possible, or sit up as far as you can in bed or on a chair. 2. Hold the incentive spirometer in an upright position. 3. Breathe out normally. 4. Place the mouthpiece in your mouth and seal your lips tightly around it. 5. Breathe in slowly and as deeply as possible, raising the piston or the ball toward the top of the column. 6. Hold your breath for 3-5 seconds or for as long as possible. Allow the piston or ball to fall to the bottom of the column. 7. Remove the mouthpiece from your mouth and breathe out normally. 8. Rest for a few seconds and repeat Steps 1 through 7 at least 10 times every 1-2 hours when you are awake. Take your time and take a few normal breaths between deep breaths. 9. The spirometer may include an indicator to show your best effort. Use the indicator as a goal to work toward during each repetition. 10. After each set of 10 deep breaths, practice coughing to be sure your lungs are clear. If you have an incision (the cut made at the time of surgery), support your  incision when coughing by placing a pillow or rolled up towels firmly against it. Once you are able to get out of bed, walk around indoors and cough well. You may stop using the incentive spirometer when instructed by your caregiver.  RISKS AND COMPLICATIONS  Take your time so you do not get dizzy or light-headed.  If you are in pain, you may need to take or ask for pain medication before doing incentive spirometry. It is harder to take a deep breath if you are having pain. AFTER USE  Rest and breathe slowly and easily.  It can be helpful to keep track of a log of your progress. Your caregiver can provide you with a simple table to help with this. If you are using the spirometer at home, follow these instructions: [place] IF:   You are having difficultly using the spirometer.  You have trouble using the spirometer as often as instructed.  Your pain medication is not giving enough relief while using the spirometer.  You develop fever of 100.5 F (38.1 C) or higher. SEEK IMMEDIATE MEDICAL CARE IF:   You cough up bloody sputum that had not been present before.  You develop fever of 102 F (38.9 C) or greater.  You develop worsening pain at or near the incision site. MAKE SURE YOU:   Understand these instructions.  Will watch your condition.  Will get help right away if you are not doing well or get worse. Document Released: 07/08/2006 Document Revised: 05/20/2011 Document Reviewed: 09/08/2006 [facility] Patient Information 2014 [place], [place].   ________________________________________________________________________

## 2020-04-13 ENCOUNTER — Encounter (HOSPITAL_COMMUNITY)
Admission: RE | Admit: 2020-04-13 | Discharge: 2020-04-13 | Disposition: A | Discharge status: Home / Self Care | Payer: MEDICARE | Source: Ambulatory Visit | Attending: Orthopedic Surgery | Admitting: Orthopedic Surgery

## 2020-04-13 ENCOUNTER — Encounter (HOSPITAL_COMMUNITY): Payer: Self-pay

## 2020-04-13 ENCOUNTER — Other Ambulatory Visit: Payer: Self-pay

## 2020-04-13 DIAGNOSIS — Z01812 Encounter for preprocedural laboratory examination: Secondary | ICD-10-CM | POA: Insufficient documentation

## 2020-04-13 HISTORY — DX: Depression, unspecified: F32.A

## 2020-04-13 LAB — CBC
HCT: 41.6 % (ref 36.0–46.0)
Hemoglobin: 14.2 g/dL (ref 12.0–15.0)
MCH: 31.8 pg (ref 26.0–34.0)
MCHC: 34.1 g/dL (ref 30.0–36.0)
MCV: 93.1 fL (ref 80.0–100.0)
Platelets: 300 10*3/uL (ref 150–400)
RBC: 4.47 MIL/uL (ref 3.87–5.11)
RDW: 12.4 % (ref 11.5–15.5)
WBC: 6.4 10*3/uL (ref 4.0–10.5)
nRBC: 0 % (ref 0.0–0.2)

## 2020-04-13 LAB — TYPE AND SCREEN
ABO/RH(D): O POS
Antibody Screen: NEGATIVE

## 2020-04-13 LAB — SURGICAL PCR SCREEN
MRSA, PCR: NEGATIVE
Staphylococcus aureus: NEGATIVE

## 2020-04-13 NOTE — Progress Notes (Signed)
COVID Vaccine Completed: Yes Date COVID Vaccine completed: 03/02/20 [place] COVID vaccine manufacturer: Pfizer     PCP - Dr. Dina Rich. LOV: 04/04/20 Cardiologist -   Chest x-ray -  EKG - 03/02/20 Stress Test -  ECHO -  Cardiac Cath -  Pacemaker/ICD device last checked:  Sleep Study -  CPAP -   Fasting Blood Sugar -  Checks Blood Sugar _____ times a day  Blood Thinner Instructions: Aspirin Instructions: Last Dose:  Anesthesia review:   Patient denies shortness of breath, fever, cough and chest pain at PAT appointment   Patient verbalized understanding of instructions that were given to them at the PAT appointment. Patient was also instructed that they will need to review over the PAT instructions again at home before surgery.

## 2020-04-16 NOTE — H&P (Addendum)
TOTAL KNEE ADMISSION H&P  Patient is being admitted for right total knee arthroplasty.  Subjective:  Chief Complaint:    Right knee OA / pain  HPI: Amy Spence, 65 y.o. female, has a history of pain and functional disability in the right knee due to arthritis and has failed non-surgical conservative treatments for greater than 12 weeks to include NSAID's and/or analgesics, corticosteriod injections, viscosupplementation injections and activity modification.  Onset of symptoms was gradual, starting 1+ years ago with gradually worsening course since that time. The patient noted no past surgery on the right knee.  Patient currently rates pain in the right knee at 9 out of 10 with activity. Patient has night pain, worsening of pain with activity and weight bearing, pain that interferes with activities of daily living, pain with passive range of motion, crepitus and joint swelling.  Patient has evidence of periarticular osteophytes and joint space narrowing by imaging studies.  There is no active infection.  Risks, benefits and expectations were discussed with the patient.  Risks including but not limited to the risk of anesthesia, blood clots, nerve damage, blood vessel damage, failure of the prosthesis, infection and up to and including death.  Patient understand the risks, benefits and expectations and wishes to proceed with surgery.   D/C Plans:       Home (SD if needed, prefers obs)  Post-op Meds:       No Rx given   Tranexamic Acid:      To be given - IV   Decadron:      Is to be given  FYI:      ASA  Norco  DME:   Rx sent for - RW & 3-N-1  PT:   OPPT   Pharmacy: [facility] - [place]    Patient Active Problem List   Diagnosis Date Noted  . S/P reverse total shoulder arthroplasty, right 12/03/2018  . Unspecified constipation 02/21/2011   Past Medical History:  Diagnosis Date  . Anemia   . Anxiety   . Arthritis   . DDD (degenerative disc disease), lumbar   . Depression   .  Erosive gastritis   . Fibromyalgia   . GERD (gastroesophageal reflux disease)   . Pneumonia     Past Surgical History:  Procedure Laterality Date  . BREAST SURGERY     augmentation  . EYE SURGERY     eye lid  . KNEE ARTHROSCOPY    . REVERSE SHOULDER ARTHROPLASTY Right 12/03/2018   Procedure: REVERSE SHOULDER ARTHROPLASTY;  Surgeon: Francena Hanly, MD;  Location: [facility];  Service: Orthopedics;  Laterality: Right;   . TONSILLECTOMY    . TUBAL LIGATION      No current facility-administered medications for this encounter.   Current Outpatient Medications  Medication Sig Dispense Refill Last Dose  . acetaminophen (TYLENOL) 650 MG CR tablet Take 1,300 mg by mouth every 8 (eight) hours as needed for pain.     . ARIPiprazole (ABILIFY) 2 MG tablet Take 2 mg by mouth at bedtime.     . Artificial Tear Solution (SOOTHE XP) SOLN Place 1 drop into both eyes 2 (two) times daily as needed (dry eyes).     . Ascorbic Acid (VITAMIN C) 1000 MG tablet Take 1,000 mg by mouth daily.     Marland Kitchen atorvastatin (LIPITOR) 20 MG tablet Take 20 mg by mouth daily.     . calcium carbonate (TUMS - DOSED IN MG ELEMENTAL CALCIUM) 500 MG chewable tablet Chew 2 tablets  by mouth 3 (three) times daily as needed for indigestion or heartburn.     . Cholecalciferol (VITAMIN D) 50 MCG (2000 UT) tablet Take 2,000 Units by mouth daily.     . Cyanocobalamin (B-12) 5000 MCG SUBL Place 5,000 mcg under the tongue daily.     Marland Kitchen docusate sodium (COLACE) 100 MG capsule Take 100 mg by mouth every 8 (eight) hours as needed for mild constipation.     . finasteride (PROSCAR) 5 MG tablet Take 2.5 mg by mouth daily. Hair Loss     . fluticasone (FLONASE) 50 MCG/ACT nasal spray Place 1 spray into both nostrils daily as needed for allergies.     Marland Kitchen gabapentin (NEURONTIN) 400 MG capsule Take 400 mg by mouth 3 (three) times daily.     Marland Kitchen glycerin adult 2 g suppository Place 1 suppository rectally daily as needed for constipation.     . hydrOXYzine  (ATARAX/VISTARIL) 10 MG tablet Take 10 mg by mouth 3 (three) times daily as needed for anxiety.     . lamoTRIgine (LAMICTAL) 100 MG tablet Take 100 mg by mouth daily.     . meloxicam (MOBIC) 15 MG tablet Take 15 mg by mouth daily.     . naltrexone (DEPADE) 50 MG tablet Take 25 mg by mouth daily.     . Prenatal Vit-Fe Fumarate-FA (PRENATAL MULTIVITAMIN) TABS tablet Take 1 tablet by mouth daily at 12 noon.     . sertraline (ZOLOFT) 100 MG tablet Take 200 mg by mouth daily.     . sodium phosphate (FLEET) 7-19 GM/118ML ENEM Place 1 enema rectally daily as needed for severe constipation.     Marland Kitchen spironolactone (ALDACTONE) 50 MG tablet Take 50 mg by mouth daily.     Marland Kitchen VYVANSE 60 MG capsule Take 60 mg by mouth every morning.     . zinc gluconate 50 MG tablet Take 50 mg by mouth daily.      Allergies  Allergen Reactions  . Latex Rash    Minor irriation    Social History   Tobacco Use  . Smoking status: Former Games developer  . Smokeless tobacco: Never Used  . Tobacco comment: as a teen  Substance Use Topics  . Alcohol use: No    Family History  Problem Relation Age of Onset  . Breast cancer Paternal Aunt   . Irritable bowel syndrome Daughter   . Dementia Father   . Arthritis Mother      Review of Systems  Constitutional: Negative.   HENT: Negative.   Eyes: Negative.   Respiratory: Negative.   Cardiovascular: Negative.   Gastrointestinal: Positive for heartburn.  Genitourinary: Negative.   Musculoskeletal: Positive for joint pain.  Skin: Negative.   Neurological: Negative.   Endo/Heme/Allergies: Negative.   Psychiatric/Behavioral: Positive for depression. The patient is nervous/anxious.      Objective:  Physical Exam Constitutional:      Appearance: She is well-developed.  HENT:     Head: Normocephalic.  Eyes:     Pupils: Pupils are equal, round, and reactive to light.  Neck:     Thyroid: No thyromegaly.     Vascular: No JVD.     Trachea: No tracheal deviation.   Cardiovascular:     Rate and Rhythm: Normal rate and regular rhythm.     Pulses: Intact distal pulses.  Pulmonary:     Effort: Pulmonary effort is normal. No respiratory distress.     Breath sounds: Normal breath sounds. No wheezing.  Abdominal:  Palpations: Abdomen is soft.     Tenderness: There is no abdominal tenderness. There is no guarding.  Musculoskeletal:     Cervical back: Neck supple.     Right knee: Swelling and bony tenderness present. No erythema or ecchymosis. Decreased range of motion. Tenderness present.  Lymphadenopathy:     Cervical: No cervical adenopathy.  Skin:    General: Skin is warm and dry.  Neurological:     Mental Status: She is alert and oriented to person, place, and time.  Psychiatric:        Mood and Affect: Mood and affect normal.       Labs:  Estimated body mass index is 32.78 kg/m as calculated from the following:   Height as of 04/13/20: 5' 3.5" (1.613 m).   Weight as of 04/13/20: 85.3 kg.   Imaging Review Plain radiographs demonstrate severe degenerative joint disease of the right knee.  The bone quality appears to be good for age and reported activity level.      Assessment/Plan:  End stage arthritis, right knee   The patient history, physical examination, clinical judgment of the provider and imaging studies are consistent with end stage degenerative joint disease of the right knee and total knee arthroplasty is deemed medically necessary. The treatment options including medical management, injection therapy arthroscopy and arthroplasty were discussed at length. The risks and benefits of total knee arthroplasty were presented and reviewed. The risks due to aseptic loosening, infection, stiffness, patella tracking problems, thromboembolic complications and other imponderables were discussed. The patient acknowledged the explanation, agreed to proceed with the plan and consent was signed. Patient is being admitted for treatment for  surgery, pain control, PT, OT, prophylactic antibiotics, VTE prophylaxis, progressive ambulation and ADL's and discharge planning. The patient is planning to be discharged home.     Patient's anticipated LOS is less than 2 midnights, meeting these requirements: - Younger than 62 - Lives within 1 hour of care - Has a competent adult at home to recover with post-op recover - NO history of  - Chronic pain requiring opiods  - Diabetes  - Coronary Artery Disease  - Heart failure  - Heart attack  - Stroke  - DVT/VTE  - Cardiac arrhythmia  - Respiratory Failure/COPD  - Renal failure  - Anemia  - Advanced Liver disease    Anastasio Auerbach. Rateel Beldin   PA-C  05/15/2020, 8:28 AM

## 2020-04-17 ENCOUNTER — Ambulatory Visit: Payer: MEDICARE | Admitting: Psychiatry

## 2020-04-21 ENCOUNTER — Other Ambulatory Visit (HOSPITAL_COMMUNITY)
Admission: RE | Admit: 2020-04-21 | Discharge: 2020-04-21 | Disposition: A | Discharge status: Home / Self Care | Payer: MEDICARE | Source: Ambulatory Visit | Attending: Orthopedic Surgery | Admitting: Orthopedic Surgery

## 2020-04-21 DIAGNOSIS — U071 COVID-19: Secondary | ICD-10-CM | POA: Insufficient documentation

## 2020-04-21 DIAGNOSIS — Z01812 Encounter for preprocedural laboratory examination: Secondary | ICD-10-CM | POA: Diagnosis present

## 2020-04-21 LAB — SARS CORONAVIRUS 2 (TAT 6-24 HRS): SARS Coronavirus 2: POSITIVE — AB

## 2020-04-22 NOTE — Progress Notes (Signed)
Notified Dr Shon Baton, MD on call of patient's Covid + result from 2/11. He will notify the patient.  She is scheduled for surgery with Dr Charlann Boxer on 2/15.  Will also send staff message to Dr Charlann Boxer as well.  Patient may be rescheduled for 10 post + result is no symptoms/mild symptom or 21 days if symptomatic/immunocompromised

## 2020-04-23 ENCOUNTER — Telehealth: Payer: Self-pay | Admitting: Nurse Practitioner

## 2020-04-23 NOTE — Telephone Encounter (Signed)
I called Amy Spence to discuss Covid symptoms and the use of Sotrovimab, a monoclonal antibody infusion for those with mild to moderate Covid symptoms and at a high risk of hospitalization.    Pt does not qualify for infusion therapy as pt's symptoms first presented > 7 days prior to timing of infusion. Symptoms tier reviewed as well as criteria for ending isolation. Preventative practices reviewed. Patient verbalized understanding   [name], NP

## 2020-05-01 ENCOUNTER — Ambulatory Visit: Payer: MEDICARE | Admitting: Psychiatry

## 2020-05-03 ENCOUNTER — Other Ambulatory Visit (HOSPITAL_COMMUNITY): Payer: MEDICARE

## 2020-05-04 NOTE — Progress Notes (Signed)
Ms. Brunell made aware to arrive at 6:10AM 05/16/2020, drink ensure at 5:30 AM, reviewed instructions, verbalized understanding. No changes in her medical history at this time.

## 2020-05-15 NOTE — Anesthesia Preprocedure Evaluation (Signed)
Anesthesia Evaluation  Patient identified by MRN, date of birth, ID band Patient awake    Reviewed: Allergy & Precautions, NPO status , Patient's Chart, lab work & pertinent test results  Airway Mallampati: I  TM Distance: >3 FB Neck ROM: Full    Dental no notable dental hx. (+) Teeth Intact   Pulmonary former smoker,    Pulmonary exam normal breath sounds clear to auscultation       Cardiovascular negative cardio ROS Normal cardiovascular exam Rhythm:Regular Rate:Normal     Neuro/Psych PSYCHIATRIC DISORDERS Anxiety Depression  Neuromuscular disease    GI/Hepatic Neg liver ROS, PUD, GERD  Medicated and Controlled,  Endo/Other  negative endocrine ROS  Renal/GU negative Renal ROS     Musculoskeletal  (+) Arthritis , Fibromyalgia -  Abdominal   Peds  Hematology  (+) Blood dyscrasia, anemia , HLD   Anesthesia Other Findings   Reproductive/Obstetrics                            Anesthesia Physical  Anesthesia Plan  ASA: II  Anesthesia Plan: Spinal   Post-op Pain Management:  Regional for Post-op pain   Induction: Intravenous  PONV Risk Score and Plan: 3 and Treatment may vary due to age or medical condition and Propofol infusion  Airway Management Planned: Natural Airway and Nasal Cannula  Additional Equipment: None  Intra-op Plan:   Post-operative Plan:   Informed Consent: I have reviewed the patients History and Physical, chart, labs and discussed the procedure including the risks, benefits and alternatives for the proposed anesthesia with the patient or authorized representative who has indicated his/her understanding and acceptance.     Dental advisory given  Plan Discussed with: CRNA and Anesthesiologist  Anesthesia Plan Comments: (  )        Anesthesia Quick Evaluation

## 2020-05-16 ENCOUNTER — Encounter (HOSPITAL_COMMUNITY): Payer: Self-pay | Admitting: Orthopedic Surgery

## 2020-05-16 ENCOUNTER — Ambulatory Visit (HOSPITAL_COMMUNITY): Payer: MEDICARE | Admitting: Anesthesiology

## 2020-05-16 ENCOUNTER — Other Ambulatory Visit: Payer: Self-pay

## 2020-05-16 ENCOUNTER — Observation Stay (HOSPITAL_COMMUNITY)
Admission: RE | Admit: 2020-05-16 | Discharge: 2020-05-17 | Disposition: A | Discharge status: Home / Self Care | Payer: MEDICARE | Attending: Orthopedic Surgery | Admitting: Orthopedic Surgery

## 2020-05-16 ENCOUNTER — Encounter (HOSPITAL_COMMUNITY)
Admission: RE | Disposition: A | Discharge status: Home / Self Care | Payer: Self-pay | Source: Home / Self Care | Attending: Orthopedic Surgery

## 2020-05-16 DIAGNOSIS — Z91040 Latex allergy status: Secondary | ICD-10-CM | POA: Insufficient documentation

## 2020-05-16 DIAGNOSIS — M25561 Pain in right knee: Secondary | ICD-10-CM | POA: Diagnosis present

## 2020-05-16 DIAGNOSIS — M1711 Unilateral primary osteoarthritis, right knee: Principal | ICD-10-CM | POA: Diagnosis present

## 2020-05-16 DIAGNOSIS — Z87891 Personal history of nicotine dependence: Secondary | ICD-10-CM | POA: Diagnosis not present

## 2020-05-16 DIAGNOSIS — Z96651 Presence of right artificial knee joint: Secondary | ICD-10-CM

## 2020-05-16 DIAGNOSIS — Z96611 Presence of right artificial shoulder joint: Secondary | ICD-10-CM | POA: Diagnosis not present

## 2020-05-16 HISTORY — PX: TOTAL KNEE ARTHROPLASTY: SHX125

## 2020-05-16 LAB — CBC
HCT: 40.4 % (ref 36.0–46.0)
Hemoglobin: 13.4 g/dL (ref 12.0–15.0)
MCH: 30.4 pg (ref 26.0–34.0)
MCHC: 33.2 g/dL (ref 30.0–36.0)
MCV: 91.6 fL (ref 80.0–100.0)
Platelets: 236 10*3/uL (ref 150–400)
RBC: 4.41 MIL/uL (ref 3.87–5.11)
RDW: 12.5 % (ref 11.5–15.5)
WBC: 6.3 10*3/uL (ref 4.0–10.5)
nRBC: 0 % (ref 0.0–0.2)

## 2020-05-16 LAB — SURGICAL PCR SCREEN
MRSA, PCR: NEGATIVE
Staphylococcus aureus: NEGATIVE

## 2020-05-16 LAB — TYPE AND SCREEN
ABO/RH(D): O POS
Antibody Screen: NEGATIVE

## 2020-05-16 SURGERY — ARTHROPLASTY, KNEE, TOTAL
Anesthesia: Spinal | Site: Knee | Laterality: Right

## 2020-05-16 MED ORDER — FLUTICASONE PROPIONATE 50 MCG/ACT NA SUSP
1.0000 | Freq: Every day | NASAL | Status: DC | PRN
  Filled 2020-05-16: qty 16

## 2020-05-16 MED ORDER — BISACODYL 10 MG RE SUPP: 10.0000 mg | Freq: Every day | RECTAL | Status: DC | PRN

## 2020-05-16 MED ORDER — ATORVASTATIN CALCIUM 20 MG PO TABS
20.0000 mg | ORAL_TABLET | Freq: Every day | ORAL | Status: DC
  Administered 2020-05-16 – 2020-05-17 (×2): 20 mg via ORAL
  Filled 2020-05-16 (×2): qty 1

## 2020-05-16 MED ORDER — SERTRALINE HCL 100 MG PO TABS
200.0000 mg | ORAL_TABLET | Freq: Every day | ORAL | Status: DC
  Administered 2020-05-16 – 2020-05-17 (×2): 200 mg via ORAL
  Filled 2020-05-16 (×2): qty 2

## 2020-05-16 MED ORDER — DOCUSATE SODIUM 100 MG PO CAPS
100.0000 mg | ORAL_CAPSULE | Freq: Two times a day (BID) | ORAL | Status: DC
  Administered 2020-05-16 – 2020-05-17 (×3): 100 mg via ORAL
  Filled 2020-05-16 (×2): qty 1

## 2020-05-16 MED ORDER — DEXAMETHASONE SODIUM PHOSPHATE 10 MG/ML IJ SOLN
INTRAMUSCULAR | Status: AC
  Filled 2020-05-16: qty 1

## 2020-05-16 MED ORDER — PROPOFOL 10 MG/ML IV BOLUS
INTRAVENOUS | Status: DC | PRN
  Administered 2020-05-16: 40 mg via INTRAVENOUS

## 2020-05-16 MED ORDER — OXYCODONE HCL 5 MG PO TABS
ORAL_TABLET | ORAL | Status: AC
  Filled 2020-05-16: qty 1

## 2020-05-16 MED ORDER — KETOROLAC TROMETHAMINE 30 MG/ML IJ SOLN
INTRAMUSCULAR | Status: AC
  Filled 2020-05-16: qty 1

## 2020-05-16 MED ORDER — MENTHOL 3 MG MT LOZG: 1.0000 | LOZENGE | OROMUCOSAL | Status: DC | PRN

## 2020-05-16 MED ORDER — SODIUM CHLORIDE 0.9 % IV SOLN: INTRAVENOUS | Status: DC

## 2020-05-16 MED ORDER — ORAL CARE MOUTH RINSE
15.0000 mL | Freq: Once | OROMUCOSAL | Status: AC
  Administered 2020-05-16: 15 mL via OROMUCOSAL

## 2020-05-16 MED ORDER — LACTATED RINGERS IV SOLN: INTRAVENOUS | Status: DC

## 2020-05-16 MED ORDER — ACETAMINOPHEN 325 MG PO TABS
ORAL_TABLET | ORAL | Status: AC
  Filled 2020-05-16: qty 2

## 2020-05-16 MED ORDER — ACETAMINOPHEN 325 MG PO TABS
325.0000 mg | ORAL_TABLET | ORAL | Status: DC | PRN
Start: 2020-05-16 — End: 2020-05-16
  Administered 2020-05-16: 650 mg via ORAL

## 2020-05-16 MED ORDER — PROPOFOL 1000 MG/100ML IV EMUL
INTRAVENOUS | Status: AC
  Filled 2020-05-16: qty 100

## 2020-05-16 MED ORDER — MAGNESIUM CITRATE PO SOLN: 1.0000 | Freq: Once | ORAL | Status: DC | PRN

## 2020-05-16 MED ORDER — FENTANYL CITRATE (PF) 100 MCG/2ML IJ SOLN
25.0000 ug | INTRAMUSCULAR | Status: DC | PRN
  Administered 2020-05-16: 50 ug via INTRAVENOUS
  Administered 2020-05-16: 25 ug via INTRAVENOUS
  Administered 2020-05-16: 50 ug via INTRAVENOUS

## 2020-05-16 MED ORDER — STERILE WATER FOR IRRIGATION IR SOLN
Status: DC | PRN
  Administered 2020-05-16: 2000 mL

## 2020-05-16 MED ORDER — ONDANSETRON HCL 4 MG/2ML IJ SOLN: 4.0000 mg | Freq: Once | INTRAMUSCULAR | Status: DC | PRN

## 2020-05-16 MED ORDER — POVIDONE-IODINE 10 % EX SWAB
2.0000 "application " | Freq: Once | CUTANEOUS | Status: AC
  Administered 2020-05-16: 2 via TOPICAL

## 2020-05-16 MED ORDER — SODIUM CHLORIDE (PF) 0.9 % IJ SOLN
INTRAMUSCULAR | Status: DC | PRN
  Administered 2020-05-16: 30 mL

## 2020-05-16 MED ORDER — FENTANYL CITRATE (PF) 100 MCG/2ML IJ SOLN
50.0000 ug | INTRAMUSCULAR | Status: DC
  Administered 2020-05-16: 50 ug via INTRAVENOUS
  Filled 2020-05-16: qty 2

## 2020-05-16 MED ORDER — PHENYLEPHRINE 40 MCG/ML (10ML) SYRINGE FOR IV PUSH (FOR BLOOD PRESSURE SUPPORT)
PREFILLED_SYRINGE | INTRAVENOUS | Status: DC | PRN
  Administered 2020-05-16: 80 ug via INTRAVENOUS

## 2020-05-16 MED ORDER — MORPHINE SULFATE (PF) 2 MG/ML IV SOLN: 0.5000 mg | INTRAVENOUS | Status: DC | PRN

## 2020-05-16 MED ORDER — FERROUS SULFATE 325 (65 FE) MG PO TABS
325.0000 mg | ORAL_TABLET | Freq: Two times a day (BID) | ORAL | Status: DC
  Administered 2020-05-16 – 2020-05-17 (×2): 325 mg via ORAL
  Filled 2020-05-16 (×2): qty 1

## 2020-05-16 MED ORDER — MIDAZOLAM HCL 2 MG/2ML IJ SOLN
1.0000 mg | INTRAMUSCULAR | Status: DC
  Administered 2020-05-16: 2 mg via INTRAVENOUS
  Filled 2020-05-16: qty 2

## 2020-05-16 MED ORDER — TRANEXAMIC ACID-NACL 1000-0.7 MG/100ML-% IV SOLN
1000.0000 mg | Freq: Once | INTRAVENOUS | Status: AC
  Administered 2020-05-16: 1000 mg via INTRAVENOUS
  Filled 2020-05-16: qty 100

## 2020-05-16 MED ORDER — MEPERIDINE HCL 50 MG/ML IJ SOLN: 6.2500 mg | INTRAMUSCULAR | Status: DC | PRN

## 2020-05-16 MED ORDER — ACETAMINOPHEN 325 MG PO TABS
325.0000 mg | ORAL_TABLET | Freq: Four times a day (QID) | ORAL | Status: DC | PRN
  Administered 2020-05-16: 650 mg via ORAL
  Filled 2020-05-16: qty 2

## 2020-05-16 MED ORDER — TRANEXAMIC ACID-NACL 1000-0.7 MG/100ML-% IV SOLN
1000.0000 mg | INTRAVENOUS | Status: AC
Start: 2020-05-16 — End: 2020-05-16
  Administered 2020-05-16: 1000 mg via INTRAVENOUS
  Filled 2020-05-16: qty 100

## 2020-05-16 MED ORDER — METHOCARBAMOL 500 MG IVPB - SIMPLE MED
INTRAVENOUS | Status: AC
  Filled 2020-05-16: qty 50

## 2020-05-16 MED ORDER — METHOCARBAMOL 500 MG IVPB - SIMPLE MED
500.0000 mg | Freq: Four times a day (QID) | INTRAVENOUS | Status: DC | PRN
  Administered 2020-05-16: 500 mg via INTRAVENOUS
  Filled 2020-05-16: qty 50

## 2020-05-16 MED ORDER — NALTREXONE HCL 50 MG PO TABS
25.0000 mg | ORAL_TABLET | Freq: Every day | ORAL | Status: DC
  Administered 2020-05-17: 25 mg via ORAL
  Filled 2020-05-16 (×2): qty 1

## 2020-05-16 MED ORDER — DEXAMETHASONE SODIUM PHOSPHATE 10 MG/ML IJ SOLN
10.0000 mg | Freq: Once | INTRAMUSCULAR | Status: AC
  Administered 2020-05-17: 10 mg via INTRAVENOUS
  Filled 2020-05-16: qty 1

## 2020-05-16 MED ORDER — EPHEDRINE 5 MG/ML INJ
INTRAVENOUS | Status: AC
  Filled 2020-05-16: qty 10

## 2020-05-16 MED ORDER — HYDROXYZINE HCL 10 MG PO TABS
10.0000 mg | ORAL_TABLET | Freq: Three times a day (TID) | ORAL | Status: DC | PRN
  Administered 2020-05-16: 10 mg via ORAL
  Filled 2020-05-16 (×3): qty 1

## 2020-05-16 MED ORDER — CEFAZOLIN SODIUM-DEXTROSE 2-4 GM/100ML-% IV SOLN
2.0000 g | INTRAVENOUS | Status: AC
  Administered 2020-05-16: 2 g via INTRAVENOUS
  Filled 2020-05-16: qty 100

## 2020-05-16 MED ORDER — CHLORHEXIDINE GLUCONATE 0.12 % MT SOLN: 15.0000 mL | Freq: Once | OROMUCOSAL | Status: AC

## 2020-05-16 MED ORDER — OXYCODONE HCL 5 MG PO TABS
5.0000 mg | ORAL_TABLET | Freq: Once | ORAL | Status: AC | PRN
  Administered 2020-05-16: 5 mg via ORAL

## 2020-05-16 MED ORDER — HYDROCODONE-ACETAMINOPHEN 7.5-325 MG PO TABS
1.0000 | ORAL_TABLET | ORAL | Status: DC | PRN
  Administered 2020-05-16: 1 via ORAL
  Administered 2020-05-16 – 2020-05-17 (×3): 2 via ORAL
  Filled 2020-05-16 (×3): qty 2
  Filled 2020-05-16: qty 1

## 2020-05-16 MED ORDER — FINASTERIDE 5 MG PO TABS
2.5000 mg | ORAL_TABLET | Freq: Every day | ORAL | Status: DC
  Administered 2020-05-17: 2.5 mg via ORAL
  Filled 2020-05-16: qty 0.5

## 2020-05-16 MED ORDER — METHOCARBAMOL 500 MG PO TABS
500.0000 mg | ORAL_TABLET | Freq: Four times a day (QID) | ORAL | Status: DC | PRN
  Administered 2020-05-16 – 2020-05-17 (×2): 500 mg via ORAL
  Filled 2020-05-16 (×2): qty 1

## 2020-05-16 MED ORDER — FENTANYL CITRATE (PF) 100 MCG/2ML IJ SOLN
INTRAMUSCULAR | Status: AC
  Filled 2020-05-16: qty 2

## 2020-05-16 MED ORDER — LISDEXAMFETAMINE DIMESYLATE 30 MG PO CAPS: 60.0000 mg | ORAL_CAPSULE | Freq: Every morning | ORAL | Status: DC

## 2020-05-16 MED ORDER — ARIPIPRAZOLE 2 MG PO TABS: 2.0000 mg | ORAL_TABLET | Freq: Every day | ORAL | Status: DC

## 2020-05-16 MED ORDER — BUPIVACAINE IN DEXTROSE 0.75-8.25 % IT SOLN
INTRATHECAL | Status: DC | PRN
  Administered 2020-05-16: 1.6 mL via INTRATHECAL

## 2020-05-16 MED ORDER — SOOTHE XP OP SOLN: 1.0000 [drp] | Freq: Two times a day (BID) | OPHTHALMIC | Status: DC | PRN

## 2020-05-16 MED ORDER — ONDANSETRON HCL 4 MG PO TABS: 4.0000 mg | ORAL_TABLET | Freq: Four times a day (QID) | ORAL | Status: DC | PRN

## 2020-05-16 MED ORDER — BUPIVACAINE-EPINEPHRINE (PF) 0.25% -1:200000 IJ SOLN
INTRAMUSCULAR | Status: AC
  Filled 2020-05-16: qty 30

## 2020-05-16 MED ORDER — POLYETHYLENE GLYCOL 3350 17 G PO PACK
17.0000 g | PACK | Freq: Two times a day (BID) | ORAL | Status: DC
  Administered 2020-05-16 – 2020-05-17 (×2): 17 g via ORAL
  Filled 2020-05-16 (×2): qty 1

## 2020-05-16 MED ORDER — SODIUM CHLORIDE 0.9 % IR SOLN
Status: DC | PRN
  Administered 2020-05-16: 1000 mL

## 2020-05-16 MED ORDER — ASPIRIN 81 MG PO CHEW
81.0000 mg | CHEWABLE_TABLET | Freq: Two times a day (BID) | ORAL | Status: DC
  Administered 2020-05-16 – 2020-05-17 (×2): 81 mg via ORAL
  Filled 2020-05-16 (×2): qty 1

## 2020-05-16 MED ORDER — METOCLOPRAMIDE HCL 5 MG/ML IJ SOLN: 5.0000 mg | Freq: Three times a day (TID) | INTRAMUSCULAR | Status: DC | PRN

## 2020-05-16 MED ORDER — METOCLOPRAMIDE HCL 5 MG PO TABS: 5.0000 mg | ORAL_TABLET | Freq: Three times a day (TID) | ORAL | Status: DC | PRN

## 2020-05-16 MED ORDER — HYDROCODONE-ACETAMINOPHEN 5-325 MG PO TABS
1.0000 | ORAL_TABLET | ORAL | Status: DC | PRN
  Administered 2020-05-16: 2 via ORAL
  Administered 2020-05-16: 1 via ORAL
  Filled 2020-05-16: qty 2
  Filled 2020-05-16: qty 1

## 2020-05-16 MED ORDER — ROPIVACAINE HCL 7.5 MG/ML IJ SOLN
INTRAMUSCULAR | Status: DC | PRN
  Administered 2020-05-16: 30 mL via PERINEURAL

## 2020-05-16 MED ORDER — ONDANSETRON HCL 4 MG/2ML IJ SOLN
INTRAMUSCULAR | Status: AC
  Filled 2020-05-16: qty 2

## 2020-05-16 MED ORDER — PHENYLEPHRINE HCL (PRESSORS) 10 MG/ML IV SOLN
INTRAVENOUS | Status: AC
  Filled 2020-05-16: qty 1

## 2020-05-16 MED ORDER — PROPOFOL 500 MG/50ML IV EMUL
INTRAVENOUS | Status: DC | PRN
  Administered 2020-05-16: 80 ug/kg/min via INTRAVENOUS

## 2020-05-16 MED ORDER — BUPIVACAINE-EPINEPHRINE (PF) 0.25% -1:200000 IJ SOLN
INTRAMUSCULAR | Status: DC | PRN
  Administered 2020-05-16: 30 mL via PERINEURAL

## 2020-05-16 MED ORDER — PHENYLEPHRINE 40 MCG/ML (10ML) SYRINGE FOR IV PUSH (FOR BLOOD PRESSURE SUPPORT)
PREFILLED_SYRINGE | INTRAVENOUS | Status: AC
  Filled 2020-05-16: qty 10

## 2020-05-16 MED ORDER — ALUM & MAG HYDROXIDE-SIMETH 200-200-20 MG/5ML PO SUSP: 15.0000 mL | ORAL | Status: DC | PRN

## 2020-05-16 MED ORDER — GABAPENTIN 400 MG PO CAPS
400.0000 mg | ORAL_CAPSULE | Freq: Three times a day (TID) | ORAL | Status: DC
  Administered 2020-05-16 – 2020-05-17 (×3): 400 mg via ORAL
  Filled 2020-05-16 (×3): qty 1

## 2020-05-16 MED ORDER — POLYVINYL ALCOHOL 1.4 % OP SOLN
1.0000 [drp] | OPHTHALMIC | Status: DC | PRN
  Filled 2020-05-16: qty 15

## 2020-05-16 MED ORDER — ONDANSETRON HCL 4 MG/2ML IJ SOLN: 4.0000 mg | Freq: Four times a day (QID) | INTRAMUSCULAR | Status: DC | PRN

## 2020-05-16 MED ORDER — LAMOTRIGINE 100 MG PO TABS
100.0000 mg | ORAL_TABLET | Freq: Every day | ORAL | Status: DC
  Administered 2020-05-16 – 2020-05-17 (×2): 100 mg via ORAL
  Filled 2020-05-16 (×2): qty 1

## 2020-05-16 MED ORDER — CEFAZOLIN SODIUM-DEXTROSE 2-4 GM/100ML-% IV SOLN
2.0000 g | Freq: Four times a day (QID) | INTRAVENOUS | Status: AC
  Administered 2020-05-16 (×2): 2 g via INTRAVENOUS
  Filled 2020-05-16 (×2): qty 100

## 2020-05-16 MED ORDER — SODIUM CHLORIDE (PF) 0.9 % IJ SOLN
INTRAMUSCULAR | Status: AC
  Filled 2020-05-16: qty 30

## 2020-05-16 MED ORDER — DEXAMETHASONE SODIUM PHOSPHATE 10 MG/ML IJ SOLN
INTRAMUSCULAR | Status: DC | PRN
  Administered 2020-05-16: 10 mg

## 2020-05-16 MED ORDER — DEXAMETHASONE SODIUM PHOSPHATE 10 MG/ML IJ SOLN: 10.0000 mg | Freq: Once | INTRAMUSCULAR | Status: DC

## 2020-05-16 MED ORDER — 0.9 % SODIUM CHLORIDE (POUR BTL) OPTIME
TOPICAL | Status: DC | PRN
  Administered 2020-05-16: 1000 mL

## 2020-05-16 MED ORDER — KETOROLAC TROMETHAMINE 30 MG/ML IJ SOLN
INTRAMUSCULAR | Status: DC | PRN
  Administered 2020-05-16: 30 mg via INTRA_ARTICULAR

## 2020-05-16 MED ORDER — OXYCODONE HCL 5 MG/5ML PO SOLN: 5.0000 mg | Freq: Once | ORAL | Status: AC | PRN

## 2020-05-16 MED ORDER — SPIRONOLACTONE 25 MG PO TABS
50.0000 mg | ORAL_TABLET | Freq: Every day | ORAL | Status: DC
  Administered 2020-05-17: 50 mg via ORAL
  Filled 2020-05-16: qty 2

## 2020-05-16 MED ORDER — ACETAMINOPHEN 160 MG/5ML PO SOLN: 325.0000 mg | ORAL | Status: DC | PRN

## 2020-05-16 MED ORDER — PHENOL 1.4 % MT LIQD: 1.0000 | OROMUCOSAL | Status: DC | PRN

## 2020-05-16 MED ORDER — GLYCOPYRROLATE PF 0.2 MG/ML IJ SOSY
PREFILLED_SYRINGE | INTRAMUSCULAR | Status: AC
  Filled 2020-05-16: qty 1

## 2020-05-16 SURGICAL SUPPLY — 57 items
ADH SKN CLS APL DERMABOND .7 (GAUZE/BANDAGES/DRESSINGS) ×1
ATTUNE MED ANAT PAT 32 KNEE (Knees) ×1 IMPLANT
ATTUNE PSFEM RTSZ4 NARCEM KNEE (Femur) ×1 IMPLANT
ATTUNE PSRP INSR SZ4 7 KNEE (Insert) ×1 IMPLANT
BAG SPEC THK2 15X12 ZIP CLS (MISCELLANEOUS)
BAG ZIPLOCK 12X15 (MISCELLANEOUS) IMPLANT
BASEPLATE TIBIAL ROTATING SZ 4 (Knees) ×1 IMPLANT
BLADE SAW SGTL 11.0X1.19X90.0M (BLADE) IMPLANT
BLADE SAW SGTL 13.0X1.19X90.0M (BLADE) ×2 IMPLANT
BLADE SURG SZ10 CARB STEEL (BLADE) ×4 IMPLANT
BNDG ELASTIC 6X5.8 VLCR STR LF (GAUZE/BANDAGES/DRESSINGS) ×2 IMPLANT
BOWL SMART MIX CTS (DISPOSABLE) ×2 IMPLANT
BSPLAT TIB 4 CMNT ROT PLAT STR (Knees) ×1 IMPLANT
COVER WAND RF STERILE (DRAPES) IMPLANT
CUFF TOURN SGL QUICK 34 (TOURNIQUET CUFF) ×2
CUFF TRNQT CYL 34X4.125X (TOURNIQUET CUFF) ×1 IMPLANT
DECANTER SPIKE VIAL GLASS SM (MISCELLANEOUS) ×4 IMPLANT
DERMABOND ADVANCED (GAUZE/BANDAGES/DRESSINGS) ×1
DERMABOND ADVANCED .7 DNX12 (GAUZE/BANDAGES/DRESSINGS) ×1 IMPLANT
DRAPE U-SHAPE 47X51 STRL (DRAPES) ×2 IMPLANT
DRESSING AQUACEL AG SP 3.5X10 (GAUZE/BANDAGES/DRESSINGS) ×1 IMPLANT
DRSG AQUACEL AG ADV 3.5X10 (GAUZE/BANDAGES/DRESSINGS) ×1 IMPLANT
DRSG AQUACEL AG SP 3.5X10 (GAUZE/BANDAGES/DRESSINGS) ×2
DURAPREP 26ML APPLICATOR (WOUND CARE) ×4 IMPLANT
ELECT REM PT RETURN 15FT ADLT (MISCELLANEOUS) ×2 IMPLANT
GLOVE ORTHO TXT STRL SZ7.5 (GLOVE) ×2 IMPLANT
GLOVE SURG ENC MOIS LTX SZ6 (GLOVE) IMPLANT
GLOVE SURG LTX SZ8 (GLOVE) ×2 IMPLANT
GLOVE SURG UNDER POLY LF SZ6.5 (GLOVE) IMPLANT
GLOVE SURG UNDER POLY LF SZ7.5 (GLOVE) ×2 IMPLANT
GLOVE SURG UNDER POLY LF SZ8.5 (GLOVE) IMPLANT
GOWN STRL REUS W/TWL 2XL LVL3 (GOWN DISPOSABLE) IMPLANT
GOWN STRL REUS W/TWL LRG LVL3 (GOWN DISPOSABLE) ×2 IMPLANT
HANDPIECE INTERPULSE COAX TIP (DISPOSABLE) ×2
HOLDER FOLEY CATH W/STRAP (MISCELLANEOUS) IMPLANT
KIT TURNOVER KIT A (KITS) ×2 IMPLANT
MANIFOLD NEPTUNE II (INSTRUMENTS) ×2 IMPLANT
NDL SAFETY ECLIPSE 18X1.5 (NEEDLE) IMPLANT
NEEDLE HYPO 18GX1.5 SHARP (NEEDLE)
NS IRRIG 1000ML POUR BTL (IV SOLUTION) ×2 IMPLANT
PACK TOTAL KNEE CUSTOM (KITS) ×2 IMPLANT
PENCIL SMOKE EVACUATOR (MISCELLANEOUS) IMPLANT
PIN DRILL FIX HALF THREAD (BIT) ×1 IMPLANT
PIN FIX SIGMA LCS THRD HI (PIN) ×1 IMPLANT
PROTECTOR NERVE ULNAR (MISCELLANEOUS) ×2 IMPLANT
SET HNDPC FAN SPRY TIP SCT (DISPOSABLE) ×1 IMPLANT
SET PAD KNEE POSITIONER (MISCELLANEOUS) ×2 IMPLANT
SUT MNCRL AB 4-0 PS2 18 (SUTURE) ×2 IMPLANT
SUT STRATAFIX PDS+ 0 24IN (SUTURE) ×2 IMPLANT
SUT VIC AB 1 CT1 36 (SUTURE) ×2 IMPLANT
SUT VIC AB 2-0 CT1 27 (SUTURE) ×6
SUT VIC AB 2-0 CT1 TAPERPNT 27 (SUTURE) ×3 IMPLANT
SYR 3ML LL SCALE MARK (SYRINGE) ×2 IMPLANT
TRAY FOLEY MTR SLVR 16FR STAT (SET/KITS/TRAYS/PACK) ×2 IMPLANT
TUBE SUCTION HIGH CAP CLEAR NV (SUCTIONS) ×2 IMPLANT
WATER STERILE IRR 1000ML POUR (IV SOLUTION) ×4 IMPLANT
WRAP KNEE MAXI GEL POST OP (GAUZE/BANDAGES/DRESSINGS) ×2 IMPLANT

## 2020-05-16 NOTE — Anesthesia Postprocedure Evaluation (Signed)
Anesthesia Post Note  Patient: Amy Spence  Procedure(s) Performed: TOTAL KNEE ARTHROPLASTY (Right Knee)     Patient location during evaluation: PACU Anesthesia Type: Spinal Level of consciousness: oriented and awake and alert Pain management: pain level controlled Vital Signs Assessment: post-procedure vital signs reviewed and stable Respiratory status: spontaneous breathing, respiratory function stable and patient connected to nasal cannula oxygen Cardiovascular status: blood pressure returned to baseline and stable Postop Assessment: no headache, no backache and no apparent nausea or vomiting Anesthetic complications: no   No complications documented.  Last Vitals:  Vitals:   05/16/20 1408 05/16/20 1507  BP: 121/70 125/62  Pulse: 75 74  Resp: 18   Temp: 36.6 C   SpO2: 96% 97%    Last Pain:  Vitals:   05/16/20 1408  TempSrc: Oral  PainSc:                  Horst Ostermiller

## 2020-05-16 NOTE — Op Note (Signed)
NAME:  Amy Spence                      MEDICAL RECORD NO.:  [id number]                             FACILITY:  [facility] [place]      PHYSICIAN:  Madlyn Frankel. Charlann Boxer, M.D.  DATE OF BIRTH:  [birth year]      DATE OF PROCEDURE:  05/16/2020                                     OPERATIVE REPORT         PREOPERATIVE DIAGNOSIS:  Right knee osteoarthritis.      POSTOPERATIVE DIAGNOSIS:  Right knee osteoarthritis.      FINDINGS:  The patient was noted to have complete loss of cartilage and   bone-on-bone arthritis with associated osteophytes in the medial and patellofemoral compartments of   the knee with a significant synovitis and associated effusion.  The patient had failed months of conservative treatment including medications, injection therapy, activity modification.     PROCEDURE:  Right total knee replacement.      COMPONENTS USED:  DePuy Attune rotating platform posterior stabilized knee   system, a size 4N femur, 4 tibia, size 7 mm PS AOX insert, and 32 anatomic patellar   button.      SURGEON:  Madlyn Frankel. Charlann Boxer, M.D.      ASSISTANT:  Lanney Gins, PA-C.      ANESTHESIA:  Regional and Spinal.      SPECIMENS:  None.      COMPLICATION:  None.      DRAINS:  None.  EBL: <200cc      TOURNIQUET TIME:   Total Tourniquet Time Documented: Thigh (Right) - 24 minutes Total: Thigh (Right) - 24 minutes  .      The patient was stable to the recovery room.      INDICATION FOR PROCEDURE:  Amy Spence is a 65 y.o. female patient of   mine.  The patient had been seen, evaluated, and treated for months conservatively in the   office with medication, activity modification, and injections.  The patient had   radiographic changes of bone-on-bone arthritis with endplate sclerosis and osteophytes noted.  Based on the radiographic changes and failed conservative measures, the patient   decided to proceed with definitive treatment, total knee replacement.  Risks of infection, DVT, component failure,  need for revision surgery, neurovascular injury were reviewed in the office setting.  The postop course was reviewed stressing the efforts to maximize post-operative satisfaction and function.  Consent was obtained for benefit of pain   relief.      PROCEDURE IN DETAIL:  The patient was brought to the operative theater.   Once adequate anesthesia, preoperative antibiotics, 2 gm of Ancef,1 gm of Tranexamic Acid, and 10 mg of Decadron administered, the patient was positioned supine with a right thigh tourniquet placed.  The  right lower extremity was prepped and draped in sterile fashion.  A time-   out was performed identifying the patient, planned procedure, and the appropriate extremity.      The right lower extremity was placed in the [facility] leg holder.  The leg was   exsanguinated, tourniquet elevated to 250 mmHg.  A midline incision was   made  followed by median parapatellar arthrotomy.  Following initial   exposure, attention was first directed to the patella.  Precut   measurement was noted to be 19 mm.  I resected down to 13 mm and used a   32 anatomic patellar button to restore patellar height as well as cover the cut surface.      The lug holes were drilled and a metal shim was placed to protect the   patella from retractors and saw blade during the procedure.      At this point, attention was now directed to the femur.  The femoral   canal was opened with a drill, irrigated to try to prevent fat emboli.  An   intramedullary rod was passed at 3 degrees valgus, 8 mm of bone was   resected off the distal femur.  Following this resection, the tibia was   subluxated anteriorly.  Using the extramedullary guide, 2 mm of bone was resected off   the proximal medial tibia.  We confirmed the gap would be   stable medially and laterally with a size 5 spacer block as well as confirmed that the tibial cut was perpendicular in the coronal plane, checking with an alignment rod.      Once this was  done, I sized the femur to be a size 4 in the anterior-   posterior dimension, chose a narrow component based on medial and   lateral dimension.  The size 4 rotation block was then pinned in   position anterior referenced using the C-clamp to set rotation.  The   anterior, posterior, and  chamfer cuts were made without difficulty nor   notching making certain that I was along the anterior cortex to help   with flexion gap stability.      The final box cut was made off the lateral aspect of distal femur.      At this point, the tibia was sized to be a size 4.  The size 4 tray was   then pinned in position through the medial third of the tubercle,   drilled, and keel punched.  Trial reduction was now carried with a 4 femur,  4 tibia, a size 7 mm PS insert, and the 32 anatomic patella botton.  The knee was brought to full extension with good flexion stability with the patella   tracking through the trochlea without application of pressure.  Given   all these findings the trial components removed.  Final components were   opened and cement was mixed.  The knee was irrigated with normal saline solution and pulse lavage.  The synovial lining was   then injected with 30 cc of 0.25% Marcaine with epinephrine, 1 cc of Toradol and 30 cc of NS for a total of 61 cc.     Final implants were then cemented onto cleaned and dried cut surfaces of bone with the knee brought to extension with a size 7 mm PS trial insert.      Once the cement had fully cured, excess cement was removed   throughout the knee.  I confirmed that I was satisfied with the range of   motion and stability, and the final size 7 mm PS AOX insert was chosen.  It was   placed into the knee.      The tourniquet had been let down at 29 minutes.  No significant   hemostasis was required.  The extensor mechanism was then reapproximated using #1 Vicryl and #  1 Stratafix sutures with the knee   in flexion.  The   remaining wound was closed  with 2-0 Vicryl and running 4-0 Monocryl.   The knee was cleaned, dried, dressed sterilely using Dermabond and   Aquacel dressing.  The patient was then   brought to recovery room in stable condition, tolerating the procedure   well.   Please note that Physician Assistant, Lanney Gins, PA-C was present for the entirety of the case, and was utilized for pre-operative positioning, peri-operative retractor management, general facilitation of the procedure and for primary wound closure at the end of the case.              Madlyn Frankel Charlann Boxer, M.D.    05/16/2020 9:36 AM

## 2020-05-16 NOTE — Interval H&P Note (Signed)
History and Physical Interval Note:  05/16/2020 7:03 AM  Amy Spence  has presented today for surgery, with the diagnosis of Right knee osteoarthritis.  The various methods of treatment have been discussed with the patient and family. After consideration of risks, benefits and other options for treatment, the patient has consented to  Procedure(s) with comments: TOTAL KNEE ARTHROPLASTY (Right) - 70 mins as a surgical intervention.  The patient's history has been reviewed, patient examined, no change in status, stable for surgery.  I have reviewed the patient's chart and labs.  Questions were answered to the patient's satisfaction.     [name]

## 2020-05-16 NOTE — Anesthesia Procedure Notes (Signed)
Anesthesia Regional Block: Adductor canal block   Pre-Anesthetic Checklist: ,, timeout performed, Correct Patient, Correct Site, Correct Laterality, Correct Procedure, Correct Position, site marked, Risks and benefits discussed,  Surgical consent,  Pre-op evaluation,  At surgeon's request and post-op pain management  Laterality: Right  Prep: chloraprep       Needles:  Injection technique: Single-shot  Needle Type: Echogenic Stimulator Needle     Needle Length: 5cm  Needle Gauge: 22     Additional Needles:   Procedures:, nerve stimulator,,, ultrasound used (permanent image in chart),,,,  Narrative:  Start time: 05/16/2020 7:46 AM End time: 05/16/2020 7:52 AM Injection made incrementally with aspirations every 5 mL.  Performed by: Personally  Anesthesiologist: Bethena Midget, MD  Additional Notes: Functioning IV was confirmed and monitors were applied.  A 51mm 22ga Arrow echogenic stimulator needle was used. Sterile prep and drape,hand hygiene and sterile gloves were used. Ultrasound guidance: relevant anatomy identified, needle position confirmed, local anesthetic spread visualized around nerve(s)., vascular puncture avoided.  Image printed for medical record. Negative aspiration and negative test dose prior to incremental administration of local anesthetic. The patient tolerated the procedure well.

## 2020-05-16 NOTE — Plan of Care (Signed)
  Problem: Education: Goal: Knowledge of General Education information will improve Description: Including pain rating scale, medication(s)/side effects and non-pharmacologic comfort measures Outcome: Progressing   Problem: Activity: Goal: Risk for activity intolerance will decrease Outcome: Progressing   Problem: Nutrition: Goal: Adequate nutrition will be maintained Outcome: Progressing   Problem: Elimination: Goal: Will not experience complications related to bowel motility Outcome: Progressing   Problem: Pain Managment: Goal: General experience of comfort will improve Outcome: Progressing   Problem: Education: Goal: Knowledge of the prescribed therapeutic regimen will improve Outcome: Progressing   Problem: Activity: Goal: Ability to avoid complications of mobility impairment will improve Outcome: Progressing   Problem: Pain Management: Goal: Pain level will decrease with appropriate interventions Outcome: Progressing   Problem: Skin Integrity: Goal: Will show signs of wound healing Outcome: Progressing   

## 2020-05-16 NOTE — Transfer of Care (Signed)
Immediate Anesthesia Transfer of Care Note  Patient: Amy Spence  Procedure(s) Performed: TOTAL KNEE ARTHROPLASTY (Right Knee)  Patient Location: PACU  Anesthesia Type:Spinal  Level of Consciousness: awake, alert  and oriented  Airway & Oxygen Therapy: Patient Spontanous Breathing and Patient connected to face mask oxygen  Post-op Assessment: Report given to RN and Post -op Vital signs reviewed and stable  Post vital signs: Reviewed and stable  Last Vitals:  Vitals Value Taken Time  BP 133/72 05/16/20 1011  Temp 36.4 C 05/16/20 1011  Pulse 74 05/16/20 1015  Resp 14 05/16/20 1015  SpO2 99 % 05/16/20 1015  Vitals shown include unvalidated device data.  Last Pain:  Vitals:   05/16/20 0745  TempSrc:   PainSc: 0-No pain         Complications: No complications documented.

## 2020-05-16 NOTE — Anesthesia Procedure Notes (Signed)
Spinal  Patient location during procedure: OR Start time: 05/16/2020 8:24 AM End time: 05/16/2020 8:26 AM Staffing Performed: resident/CRNA  Resident/CRNA: [place] Jolayne Branson C, CRNA Preanesthetic Checklist Completed: patient identified, IV checked, site marked, risks and benefits discussed, surgical consent, monitors and equipment checked, pre-op evaluation and timeout performed Spinal Block Patient position: sitting Prep: DuraPrep and site prepped and draped Patient monitoring: heart rate, cardiac monitor, continuous pulse ox and blood pressure Approach: midline Location: L3-4 Injection technique: single-shot Needle Needle type: Pencan  Needle gauge: 24 G Needle length: 9 cm Assessment Sensory level: T4 Additional Notes IV functioning, monitors applied to pt. Expiration date of kit checked and confirmed to be in date. Sterile prep and drape, hand hygiene and sterile gloved used. Pt was positioned and spine was prepped in sterile fashion. Skin was anesthetized with lidocaine. Free flow of clear CSF obtained prior to injecting local anesthetic into CSF x 1 attempt. Spinal needle aspirated freely following injection. Needle was carefully withdrawn, and pt tolerated procedure well. Loss of motor and sensory on exam post injection.

## 2020-05-16 NOTE — Progress Notes (Signed)
AssistedDr. Oddono with right, ultrasound guided, adductor canal block. Side rails up, monitors on throughout procedure. See vital signs in flow sheet. Tolerated Procedure well.  

## 2020-05-16 NOTE — Evaluation (Signed)
Physical Therapy Evaluation Patient Details Name: Amy Spence MRN: [id number] DOB: Jun 01, 1955 Today's Date: 05/16/2020   History of Present Illness  65 y.o. female admitted for R TKA on 05/16/20, PMH of DDD, R reverse TSA 11/2018, fibromyalgia, GERD  Clinical Impression  Pt is s/p TKA resulting in the deficits listed below (see PT Problem List). Min assist for sit to stand, pt became dizzy with weight shifting in standing and R knee buckled. Assisted back to bed where BP was 123/62, HR 73, SaO2 97% on room air. Initiated TKA HEP. Good progress expected.  Pt will benefit from skilled PT to increase their independence and safety with mobility to allow discharge to the venue listed below.      Follow Up Recommendations Follow surgeon's recommendation for DC plan and follow-up therapies;Outpatient PT    Equipment Recommendations  Rolling walker with 5" wheels;3in1 (PT)    Recommendations for Other Services       Precautions / Restrictions Precautions Precautions: Knee;Fall Precaution Comments: reviewed no pillow under knee Restrictions Weight Bearing Restrictions: No      Mobility  Bed Mobility Overal bed mobility: Needs Assistance Bed Mobility: Supine to Sit;Sit to Supine     Supine to sit: Supervision;HOB elevated Sit to supine: Min assist   General bed mobility comments: used bedrail for raising trunk, min A for RLE into bed    Transfers Overall transfer level: Needs assistance Equipment used: Rolling walker (2 wheeled) Transfers: Sit to/from Stand;Lateral/Scoot Transfers Sit to Stand: Min assist;From elevated surface        Lateral/Scoot Transfers: Min guard General transfer comment: VCs hand placement, min A to power up, in standing with weight shifting  pt felt dizzy and RLE buckled to returned to bed where pt was able to lateral scoot x 3 towards HOB. Assisted pt back to supine. Supine BP was 123/62, HR 74, SaOw 97% on room air.  Ambulation/Gait              General Gait Details: deferred 2* dizziness  Stairs            Wheelchair Mobility    Modified Rankin (Stroke Patients Only)       Balance Overall balance assessment: Needs assistance Sitting-balance support: Feet supported Sitting balance-Leahy Scale: Good     Standing balance support: Bilateral upper extremity supported Standing balance-Leahy Scale: Poor                               Pertinent Vitals/Pain Pain Assessment: 0-10 Pain Score: 5  Pain Location: R knee Pain Descriptors / Indicators: Sore Pain Intervention(s): Limited activity within patient's tolerance;Monitored during session;Premedicated before session;Ice applied    Home Living Family/patient expects to be discharged to:: Private residence Living Arrangements: Spouse/significant other Available Help at Discharge: Family Type of Home: House Home Access: Stairs to enter Entrance Stairs-Rails: Lawyer of Steps: 5 Home Layout: One level Home Equipment: None      Prior Function Level of Independence: Independent         Comments: walked without AD, 1 fall in past year due to the dog tripping her     Hand Dominance        Extremity/Trunk Assessment   Upper Extremity Assessment Upper Extremity Assessment: Overall WFL for tasks assessed    Lower Extremity Assessment Lower Extremity Assessment: RLE deficits/detail RLE Deficits / Details: SLR 2/5, knee ext -3/5, AAROM knee 5-35* limited by pain RLE  Sensation: WNL    Cervical / Trunk Assessment Cervical / Trunk Assessment: Normal  Communication   Communication: No difficulties  Cognition Arousal/Alertness: Awake/alert Behavior During Therapy: WFL for tasks assessed/performed Overall Cognitive Status: Within Functional Limits for tasks assessed                                        General Comments      Exercises Total Joint Exercises Ankle Circles/Pumps: AROM;Both;10  reps Quad Sets: AROM;Both;5 reps;Supine Heel Slides: AAROM;Right;10 reps;Supine Long Arc Quad: AROM;Right;5 reps;Seated   Assessment/Plan    PT Assessment Patient needs continued PT services  PT Problem List Decreased strength;Decreased mobility;Decreased range of motion;Decreased activity tolerance;Decreased balance;Pain;Decreased knowledge of use of DME       PT Treatment Interventions DME instruction;Gait training;Stair training;Functional mobility training;Therapeutic exercise;Therapeutic activities;Patient/family education;Balance training    PT Goals (Current goals can be found in the Care Plan section)  Acute Rehab PT Goals Patient Stated Goal: to walk farther PT Goal Formulation: With patient/family Time For Goal Achievement: 05/30/20 Potential to Achieve Goals: Good    Frequency 7X/week   Barriers to discharge        Co-evaluation               AM-PAC PT "6 Clicks" Mobility  Outcome Measure Help needed turning from your back to your side while in a flat bed without using bedrails?: A Little Help needed moving from lying on your back to sitting on the side of a flat bed without using bedrails?: A Little Help needed moving to and from a bed to a chair (including a wheelchair)?: A Lot Help needed standing up from a chair using your arms (e.g., wheelchair or bedside chair)?: A Little Help needed to walk in hospital room?: A Lot Help needed climbing 3-5 steps with a railing? : Total 6 Click Score: 14    End of Session Equipment Utilized During Treatment: Gait belt Activity Tolerance: Treatment limited secondary to medical complications (Comment) (dizzy in standing) Patient left: in bed;with call bell/phone within reach;with bed alarm set;with family/visitor present Nurse Communication: Mobility status PT Visit Diagnosis: Muscle weakness (generalized) (M62.81);Difficulty in walking, not elsewhere classified (R26.2);Pain Pain - Right/Left: Right Pain - part of  body: Knee    Time: 1450-1519 PT Time Calculation (min) (ACUTE ONLY): 29 min   Charges:   PT Evaluation $PT Eval Low Complexity: 1 Low PT Treatments $Therapeutic Activity: 8-22 mins        Ralene Bathe Kistler PT 05/16/2020  Acute Rehabilitation Services Pager [phone] Office [phone]

## 2020-05-17 ENCOUNTER — Encounter (HOSPITAL_COMMUNITY): Payer: Self-pay | Admitting: Orthopedic Surgery

## 2020-05-17 DIAGNOSIS — M1711 Unilateral primary osteoarthritis, right knee: Secondary | ICD-10-CM | POA: Diagnosis not present

## 2020-05-17 LAB — BASIC METABOLIC PANEL
Anion gap: 8 (ref 5–15)
BUN: 15 mg/dL (ref 8–23)
CO2: 24 mmol/L (ref 22–32)
Calcium: 8.7 mg/dL — ABNORMAL LOW (ref 8.9–10.3)
Chloride: 105 mmol/L (ref 98–111)
Creatinine, Ser: 0.71 mg/dL (ref 0.44–1.00)
GFR, Estimated: >60 mL/min (ref >60)
Glucose, Bld: 109 mg/dL — ABNORMAL HIGH (ref 70–99)
Potassium: 4.4 mmol/L (ref 3.5–5.1)
Sodium: 137 mmol/L (ref 135–145)

## 2020-05-17 LAB — CBC
HCT: 33.4 % — ABNORMAL LOW (ref 36.0–46.0)
Hemoglobin: 11 g/dL — ABNORMAL LOW (ref 12.0–15.0)
MCH: 30.6 pg (ref 26.0–34.0)
MCHC: 32.9 g/dL (ref 30.0–36.0)
MCV: 92.8 fL (ref 80.0–100.0)
Platelets: 243 10*3/uL (ref 150–400)
RBC: 3.6 MIL/uL — ABNORMAL LOW (ref 3.87–5.11)
RDW: 12.3 % (ref 11.5–15.5)
WBC: 12 10*3/uL — ABNORMAL HIGH (ref 4.0–10.5)
nRBC: 0 % (ref 0.0–0.2)

## 2020-05-17 MED ORDER — POLYETHYLENE GLYCOL 3350 17 G PO PACK: 17.0000 g | PACK | Freq: Two times a day (BID) | ORAL | 0 refills | Status: DC

## 2020-05-17 MED ORDER — CELECOXIB 200 MG PO CAPS: 200.0000 mg | ORAL_CAPSULE | Freq: Two times a day (BID) | ORAL | 0 refills | Status: DC

## 2020-05-17 MED ORDER — HYDROCODONE-ACETAMINOPHEN 7.5-325 MG PO TABS: 1.0000 | ORAL_TABLET | ORAL | 0 refills | Status: DC | PRN

## 2020-05-17 MED ORDER — FERROUS SULFATE 325 (65 FE) MG PO TABS: 325.0000 mg | ORAL_TABLET | Freq: Three times a day (TID) | ORAL | 0 refills | Status: DC

## 2020-05-17 MED ORDER — DOCUSATE SODIUM 100 MG PO CAPS: 100.0000 mg | ORAL_CAPSULE | Freq: Two times a day (BID) | ORAL | 0 refills | Status: DC

## 2020-05-17 MED ORDER — ASPIRIN 81 MG PO CHEW: 81.0000 mg | CHEWABLE_TABLET | Freq: Two times a day (BID) | ORAL | 0 refills | Status: AC

## 2020-05-17 MED ORDER — METHOCARBAMOL 500 MG PO TABS: 500.0000 mg | ORAL_TABLET | Freq: Four times a day (QID) | ORAL | 0 refills | Status: DC | PRN

## 2020-05-17 NOTE — Progress Notes (Signed)
   Subjective: 1 Day Post-Op Procedure(s) (LRB): TOTAL KNEE ARTHROPLASTY (Right) Patient reports pain as mild.   Patient seen in rounds with Dr. Charlann Boxer. Patient is well, and has had no acute complaints or problems other than discomfort in the right knee. She is resting in bed this morning eating breakfast on exam. Foley catheter removed. She reports her knee buckled with PT yesterday, so she was limited in her activities.  We will continue therapy today.   Objective: Vital signs in last 24 hours: Temp:  [97.1 F (36.2 C)-98.5 F (36.9 C)] 98.5 F (36.9 C) (03/09 0453) Pulse Rate:  [67-75] 73 (03/09 0453) Resp:  [15-23] 16 (03/09 0453) BP: (102-133)/(62-85) 118/76 (03/09 0453) SpO2:  [94 %-100 %] 95 % (03/09 0453) Weight:  [85.3 kg] 85.3 kg (03/08 1202)  Intake/Output from previous day:  Intake/Output Summary (Last 24 hours) at 05/17/2020 0849 Last data filed at 05/17/2020 0550 Gross per 24 hour  Intake 3133.92 ml  Output 5100 ml  Net -1966.08 ml     Intake/Output this shift: No intake/output data recorded.  Labs: Recent Labs    05/16/20 0630 05/17/20 0316  HGB 13.4 11.0*   Recent Labs    05/16/20 0630 05/17/20 0316  WBC 6.3 12.0*  RBC 4.41 3.60*  HCT 40.4 33.4*  PLT 236 243   Recent Labs    05/17/20 0316  NA 137  K 4.4  CL 105  CO2 24  BUN 15  CREATININE 0.71  GLUCOSE 109*  CALCIUM 8.7*   No results for input(s): LABPT, INR in the last 72 hours.  Exam: General - Patient is Alert and Oriented Extremity - Neurologically intact Sensation intact distally Intact pulses distally Dorsiflexion/Plantar flexion intact Dressing - dressing C/D/I Motor Function - intact, moving foot and toes well on exam.   Past Medical History:  Diagnosis Date  . Anemia   . Anxiety   . Arthritis   . DDD (degenerative disc disease), lumbar   . Depression   . Erosive gastritis   . Fibromyalgia   . GERD (gastroesophageal reflux disease)   . Pneumonia      Assessment/Plan: 1 Day Post-Op Procedure(s) (LRB): TOTAL KNEE ARTHROPLASTY (Right) Principal Problem:   Osteoarthritis of right knee Active Problems:   S/P total knee arthroplasty, right  Estimated body mass index is 33.31 kg/m as calculated from the following:   Height as of this encounter: 5\' 3"  (1.6 m).   Weight as of this encounter: 85.3 kg. Advance diet Up with therapy D/C IV fluids   Patient's anticipated LOS is less than 2 midnights, meeting these requirements: - Younger than 48 - Lives within 1 hour of care - Has a competent adult at home to recover with post-op recover - NO history of  - Chronic pain requiring opiods  - Diabetes  - Coronary Artery Disease  - Heart failure  - Heart attack  - Stroke  - DVT/VTE  - Cardiac arrhythmia  - Respiratory Failure/COPD  - Renal failure  - Anemia  - Advanced Liver disease   DVT Prophylaxis - Aspirin Weight bearing as tolerated.  Plan is to go Home after hospital stay. Plan to work with PT today on ambulation. If she is progressing well and meeting her goals, she may discharge home this afternoon after 2 sessions of PT. Otherwise, discharge tomorrow. Follow up in the office in 2 weeks.   76, PA-C Orthopedic Surgery [phone] 05/17/2020, 8:49 AM

## 2020-05-17 NOTE — Discharge Summary (Signed)
Patient ID: Amy Spence MRN: [id number] DOB/AGE: [birth year] 65 y.o.  Admit date: 05/16/2020 Discharge date: 05/17/2020  Admission Diagnoses:  Principal Problem:   Osteoarthritis of right knee Active Problems:   S/P total knee arthroplasty, right   Discharge Diagnoses:  Same  Past Medical History:  Diagnosis Date  . Anemia   . Anxiety   . Arthritis   . DDD (degenerative disc disease), lumbar   . Depression   . Erosive gastritis   . Fibromyalgia   . GERD (gastroesophageal reflux disease)   . Pneumonia     Surgeries: Procedure(s): RIGHT TOTAL KNEE ARTHROPLASTY on 05/16/2020   Consultants: N/A  Discharged Condition: Improved  Hospital Course: Amy Spence is an 65 y.o. female who was admitted 05/16/2020 for operative treatment ofOsteoarthritis of right knee. Patient has severe unremitting pain that affects sleep, daily activities, and work/hobbies. After pre-op clearance the patient was taken to the operating room on 05/16/2020 and underwent  Procedure(s): RIGHT TOTAL KNEE ARTHROPLASTY.    Patient was given perioperative antibiotics:  Anti-infectives (From admission, onward)   Start     Dose/Rate Route Frequency Ordered Stop   05/16/20 1015  ceFAZolin (ANCEF) IVPB 2g/100 mL premix        2 g 200 mL/hr over 30 Minutes Intravenous Every 6 hours 05/16/20 1006 05/17/20 0759   05/16/20 0615  ceFAZolin (ANCEF) IVPB 2g/100 mL premix        2 g 200 mL/hr over 30 Minutes Intravenous On call to O.R. 05/16/20 2010 05/16/20 0712       Patient was given sequential compression devices, early ambulation, and chemoprophylaxis to prevent DVT.  Patient benefited maximally from hospital stay and there were no complications.    Recent vital signs:  Patient Vitals for the past 24 hrs:  BP Temp Temp src Pulse Resp SpO2  05/17/20 0900 126/73 97.9 F (36.6 C) Oral 73 18 97 %  05/17/20 0453 118/76 98.5 F (36.9 C) Oral 73 16 95 %  05/17/20 0143 125/63 97.8 F (36.6 C) Oral 72 16 94 %   05/16/20 2136 126/70 98 F (36.7 C) - 71 16 98 %     Recent laboratory studies:  Recent Labs    05/16/20 0630 05/17/20 0316  WBC 6.3 12.0*  HGB 13.4 11.0*  HCT 40.4 33.4*  PLT 236 243  NA  --  137  K  --  4.4  CL  --  105  CO2  --  24  BUN  --  15  CREATININE  --  0.71  GLUCOSE  --  109*  CALCIUM  --  8.7*     Discharge Medications:   Allergies as of 05/17/2020      Reactions   Latex Rash   Minor irriation      Medication List    STOP taking these medications   acetaminophen 650 MG CR tablet Commonly known as: TYLENOL   meloxicam 15 MG tablet Commonly known as: MOBIC   naltrexone 50 MG tablet Commonly known as: DEPADE     TAKE these medications   ARIPiprazole 2 MG tablet Commonly known as: ABILIFY Take 2 mg by mouth at bedtime.   aspirin 81 MG chewable tablet Commonly known as: Aspirin Childrens Chew 1 tablet (81 mg total) by mouth 2 (two) times daily. Take for 4 weeks, then resume regular dose.   atorvastatin 20 MG tablet Commonly known as: LIPITOR Take 20 mg by mouth daily.   B-12 5000 MCG Subl Place 5,000  mcg under the tongue daily.   calcium carbonate 500 MG chewable tablet Commonly known as: TUMS - dosed in mg elemental calcium Chew 2 tablets by mouth 3 (three) times daily as needed for indigestion or heartburn.   celecoxib 200 MG capsule Commonly known as: CeleBREX Take 1 capsule (200 mg total) by mouth 2 (two) times daily.   docusate sodium 100 MG capsule Commonly known as: COLACE Take 100 mg by mouth every 8 (eight) hours as needed for mild constipation. What changed: Another medication with the same name was added. Make sure you understand how and when to take each.   docusate sodium 100 MG capsule Commonly known as: Colace Take 1 capsule (100 mg total) by mouth 2 (two) times daily. What changed: You were already taking a medication with the same name, and this prescription was added. Make sure you understand how and when to take  each.   ferrous sulfate 325 (65 FE) MG tablet Commonly known as: FerrouSul Take 1 tablet (325 mg total) by mouth 3 (three) times daily with meals for 14 days.   finasteride 5 MG tablet Commonly known as: PROSCAR Take 2.5 mg by mouth daily. Hair Loss   fluticasone 50 MCG/ACT nasal spray Commonly known as: FLONASE Place 1 spray into both nostrils daily as needed for allergies.   gabapentin 400 MG capsule Commonly known as: NEURONTIN Take 400 mg by mouth 3 (three) times daily.   glycerin adult 2 g suppository Place 1 suppository rectally daily as needed for constipation.   HYDROcodone-acetaminophen 7.5-325 MG tablet Commonly known as: Norco Take 1-2 tablets by mouth every 4 (four) hours as needed for moderate pain.   hydrOXYzine 10 MG tablet Commonly known as: ATARAX/VISTARIL Take 10 mg by mouth 3 (three) times daily as needed for anxiety.   lamoTRIgine 100 MG tablet Commonly known as: LAMICTAL Take 100 mg by mouth daily.   methocarbamol 500 MG tablet Commonly known as: Robaxin Take 1 tablet (500 mg total) by mouth every 6 (six) hours as needed for muscle spasms.   polyethylene glycol 17 g packet Commonly known as: MIRALAX / GLYCOLAX Take 17 g by mouth 2 (two) times daily.   prenatal multivitamin Tabs tablet Take 1 tablet by mouth daily at 12 noon.   sertraline 100 MG tablet Commonly known as: ZOLOFT Take 200 mg by mouth daily.   sodium phosphate 7-19 GM/118ML Enem Place 1 enema rectally daily as needed for severe constipation.   Soothe XP Soln Place 1 drop into both eyes 2 (two) times daily as needed (dry eyes).   spironolactone 50 MG tablet Commonly known as: ALDACTONE Take 50 mg by mouth daily.   vitamin C 1000 MG tablet Take 1,000 mg by mouth daily.   Vitamin D 50 MCG (2000 UT) tablet Take 2,000 Units by mouth daily.   Vyvanse 60 MG capsule Generic drug: lisdexamfetamine Take 60 mg by mouth every morning.   zinc gluconate 50 MG tablet Take 50 mg  by mouth daily.            Discharge Care Instructions  (From admission, onward)         Start     Ordered   05/17/20 0000  Change dressing       Comments: Maintain surgical dressing until follow up in the clinic. If the edges start to pull up, may reinforce with tape. If the dressing is no longer working, may remove and cover with gauze and tape, but must keep the area  dry and clean.  Call with any questions or concerns.   05/17/20 1111          Diagnostic Studies: No results found.  Disposition: Home  Discharge Instructions    Call MD / Call 911   Complete by: As directed    If you experience chest pain or shortness of breath, CALL 911 and be transported to the hospital emergency room.  If you develope a fever above 101 F, pus (white drainage) or increased drainage or redness at the wound, or calf pain, call your surgeon's office.   Change dressing   Complete by: As directed    Maintain surgical dressing until follow up in the clinic. If the edges start to pull up, may reinforce with tape. If the dressing is no longer working, may remove and cover with gauze and tape, but must keep the area dry and clean.  Call with any questions or concerns.   Constipation Prevention   Complete by: As directed    Drink plenty of fluids.  Prune juice may be helpful.  You may use a stool softener, such as Colace (over the counter) 100 mg twice a day.  Use MiraLax (over the counter) for constipation as needed.   Diet - low sodium heart healthy   Complete by: As directed    Discharge instructions   Complete by: As directed    Maintain surgical dressing until follow up in the clinic. If the edges start to pull up, may reinforce with tape. If the dressing is no longer working, may remove and cover with gauze and tape, but must keep the area dry and clean.  Follow up in 2 weeks at [facility]. Call with any questions or concerns.   Increase activity slowly as tolerated   Complete by: As directed     Weight bearing as tolerated with assist device (walker, cane, etc) as directed, use it as long as suggested by your surgeon or therapist, typically at least 4-6 weeks.   TED hose   Complete by: As directed    Use stockings (TED hose) for 2 weeks on both leg(s).  You may remove them at night for sleeping.       Follow-up Information    Durene Romans, MD. Schedule an appointment as soon as possible for a visit in 2 weeks.   Specialty: Orthopedic Surgery Contact information: [address] [place] [address] [phone]                Signed: Genelle Gather [facility] 05/17/2020, 3:38 PM

## 2020-05-17 NOTE — Progress Notes (Signed)
Physical Therapy Treatment Patient Details Name: Amy Spence MRN: [id number] DOB: 1955/03/28 Today's Date: 05/17/2020    History of Present Illness 65 y.o. female admitted for R TKA on 05/16/20, PMH of DDD, R reverse TSA 11/2018, fibromyalgia, GERD    PT Comments    Pt is progressing well with mobility, she ambulated 95' + 50 with RW, no loss of balance, no buckling of R knee today. Stair training completed. Pt demonstrates understanding of HEP. She is ready to DC home from PT standpoint.    Follow Up Recommendations  Follow surgeons recommendation for DC plan and follow-up therapies;Outpatient PT     Equipment Recommendations  Rolling walker with 5" wheels;3in1 (PT)    Recommendations for Other Services       Precautions / Restrictions Precautions Precautions: Knee;Fall Precaution Comments: reviewed no pillow under knee Restrictions Weight Bearing Restrictions: No Other Position/Activity Restrictions: WBAT    Mobility  Bed Mobility Overal bed mobility: Modified Independent Bed Mobility: Supine to Sit     Supine to sit: HOB elevated;Modified independent (Device/Increase time)     General bed mobility comments: used bedrail for raising trunk    Transfers Overall transfer level: Needs assistance Equipment used: Rolling walker (2 wheeled) Transfers: Sit to/from Stand Sit to Stand: Min guard         General transfer comment: VCs hand placement, min/guard for safety  Ambulation/Gait Ambulation/Gait assistance: Supervision Gait Distance (Feet): 145 Feet (95' + 50') Assistive device: Rolling walker (2 wheeled) Gait Pattern/deviations: Step-to pattern;Decreased step length - right;Decreased step length - left Gait velocity: decr   General Gait Details: VCs sequencing, steady with RW, no loss of balance   Stairs Stairs: Yes Stairs assistance: Min guard Stair Management: One rail Right;Sideways;Step to pattern Number of Stairs: 3 General stair comments: VCs  sequencing, min/guard safety   Wheelchair Mobility    Modified Rankin (Stroke Patients Only)       Balance Overall balance assessment: Needs assistance Sitting-balance support: Feet supported Sitting balance-Leahy Scale: Good     Standing balance support: Bilateral upper extremity supported Standing balance-Leahy Scale: Fair                              Cognition Arousal/Alertness: Awake/alert Behavior During Therapy: WFL for tasks assessed/performed Overall Cognitive Status: Within Functional Limits for tasks assessed                                        Exercises Total Joint Exercises Ankle Circles/Pumps: AROM;Both;10 reps Quad Sets: AROM;Both;5 reps;Supine Short Arc Quad: AROM;Right;10 reps;Supine Heel Slides: AAROM;Right;10 reps;Supine Hip ABduction/ADduction: AROM;Right;5 reps;Supine Straight Leg Raises: AROM;Right;5 reps;Supine Long Arc Quad: AROM;Right;5 reps;Seated Knee Flexion: AAROM;Right;10 reps;Seated Goniometric ROM: 5-90* AAROM R knee    General Comments        Pertinent Vitals/Pain Pain Score: 5  Pain Location: R knee Pain Descriptors / Indicators: Sore Pain Intervention(s): Limited activity within patient's tolerance;Monitored during session;Premedicated before session;Ice applied    Home Living                      Prior Function            PT Goals (current goals can now be found in the care plan section) Acute Rehab PT Goals Patient Stated Goal: to walk farther PT Goal Formulation: With patient/family Time For  Goal Achievement: 05/30/20 Potential to Achieve Goals: Good Progress towards PT goals: Progressing toward goals    Frequency    7X/week      PT Plan Current plan remains appropriate    Co-evaluation              AM-PAC PT "6 Clicks" Mobility   Outcome Measure  Help needed turning from your back to your side while in a flat bed without using bedrails?: None Help needed  moving from lying on your back to sitting on the side of a flat bed without using bedrails?: A Little Help needed moving to and from a bed to a chair (including a wheelchair)?: A Little Help needed standing up from a chair using your arms (e.g., wheelchair or bedside chair)?: A Little Help needed to walk in hospital room?: A Little Help needed climbing 3-5 steps with a railing? : A Little 6 Click Score: 19    End of Session Equipment Utilized During Treatment: Gait belt Activity Tolerance: Patient tolerated treatment well (dizzy in standing) Patient left: with call bell/phone within reach;in chair Nurse Communication: Mobility status PT Visit Diagnosis: Muscle weakness (generalized) (M62.81);Difficulty in walking, not elsewhere classified (R26.2);Pain Pain - Right/Left: Right Pain - part of body: Knee     Time: 3536-1443 PT Time Calculation (min) (ACUTE ONLY): 39 min  Charges:  $Gait Training: 8-22 mins $Therapeutic Exercise: 8-22 mins $Therapeutic Activity: 8-22 mins                    Ralene Bathe Kistler PT 05/17/2020  Acute Rehabilitation Services Pager [phone] Office [phone]

## 2020-05-17 NOTE — Discharge Instructions (Signed)

## 2020-05-17 NOTE — TOC Transition Note (Signed)
Transition of Care [facility]) - CM/SW Discharge Note   Patient Details  Name: Amy Spence MRN: [id number] Date of Birth: [birth year]  Transition of Care [facility]) CM/SW Contact:  Clearance Coots, LCSW Phone Number: 05/17/2020, 9:46 AM   Clinical Narrative:    Prearranged therapy plan: OPPT RW and 3 in1 ordered and delivered to the patient bedside by [employer].   Final next level of care: OP Rehab Barriers to Discharge: Barriers Resolved   Patient Goals and CMS Choice        Discharge Placement                       Discharge Plan and Services                DME Arranged: 3-N-1,Walker rolling DME Agency: [employer] Date DME Agency Contacted: 05/17/20 Time DME Agency Contacted: [id number] Representative spoke with at DME Agency: Harrold Donath            Social Determinants of Health (SDOH) Interventions     Readmission Risk Interventions No flowsheet data found.

## 2020-06-06 ENCOUNTER — Ambulatory Visit (INDEPENDENT_AMBULATORY_CARE_PROVIDER_SITE_OTHER): Payer: MEDICARE | Admitting: Psychiatry

## 2020-06-06 ENCOUNTER — Other Ambulatory Visit: Payer: Self-pay

## 2020-06-06 DIAGNOSIS — F411 Generalized anxiety disorder: Secondary | ICD-10-CM | POA: Diagnosis not present

## 2020-06-06 DIAGNOSIS — Z96651 Presence of right artificial knee joint: Secondary | ICD-10-CM

## 2020-06-06 DIAGNOSIS — F410 Panic disorder [episodic paroxysmal anxiety] without agoraphobia: Secondary | ICD-10-CM

## 2020-06-06 DIAGNOSIS — F5089 Other specified eating disorder: Secondary | ICD-10-CM

## 2020-06-06 NOTE — Progress Notes (Incomplete)
Psychotherapy Progress Note [facility] [name], PhD LP  Patient ID: Amy Spence     MRN: [id number] Therapy format: Individual psychotherapy Date: 06/06/2020      Start: 2:22p     Stop: 3:10p     Time Spent: 48 min Location: In-person   Session narrative (presenting needs, interim history, self-report of stressors and symptoms, applications of prior therapy, status changes, and interventions made in session) Has had TKR on right knee, glad to be out of joint pain, dealing with postsurgical discomfort, doing PT 3/wk.  Delayed by COVID test initially.  Optimistic about further recovery, weaning pain meds.  Disappointed that she has been gaining weight in the meantime.  2 months out from last weight program visit, 3 from therapy.    Discussed tactics for food control, e.g., reflexively getting some white cheddar popcorn or another snack in front of the TV at night.  Current strategy, given by [facility] weight loss center, is to eat 5 times a day in a 16-hour window and keep overall calorie count below a ceiling number.  Discussed this and other nutritional ideas, along with her patterns, and determined she is highly likely to have insulin resistance and highly conditioned snack behavior as important factors in her problem.  While portion control and calorie count are important and can be very helpful, educated on the the role of what kind of calories and timing as well as typical settings for overeating and emotional environment.  Advocated a carb control approach popularized by the [employer] for carbohydrate addiction, in which carbs are mainly sequestered to 1 hour of the day and eaten on top of noncarb or low-carb foods and what is called the "reward meal".  Educated about deconditioning pancreatic response and cellular reactions to insulin.  Also noted conditioned response with TV and lowered resistance in the evening after becoming more neurologically tired during  the day.  Suggested a narrow order window, if attainable, for example, a 12-hour period from about 8 AM to 8 PM and at least to 2 and hopefully 3-hour buffer between last food and sleep.  Educated that sleep is more restful unsatisfying and conducive to weight loss if one goes to bed a little bit hungry.  As for eating behavior, acknowledged that there will be times where she breaks protocol and snacks, most likely.  For morale sake, it is important to have partial victories she can obtain in all facets of her eating program, be they calorie control, portion control, timing, avoidance of binge foods, or other variables.  Introduced the approach of mindful eating as a form of behavioral flexibility and a form of self soothing in the midst of eating, so that food itself does not gain power as a sedative.  In particular, advocated that she take a desirable snack food, get away from the TV, and pay full attention to that food item, trying to save her it with all 5 senses.  In the case of white cheddar popcorn, for instance, that would mean looking over all sides of a puff, seeing the contours and colors, smelling it, turning it over between your fingers to feel the contours, rubbing fingers on it to hear what it sounds like, then letting it roll around your mouth so your tongue can feel it, too, and feeling and tasting what happens as it is broken down by the teeth before swallowing.  Provided the slogan, "DO play with your food" as a way to remember  it.  Inspired by the ideas and enthusiastic to try them.  Recommended continue measuring, food curfew   Therapeutic modalities: Cognitive Behavioral Therapy, Solution-Oriented/Positive Psychology and Psycho-education/Bibliotherapy  Mental Status/Observations:  Appearance:   Casual     Behavior:  Appropriate  Motor:  Normal  Speech/Language:   Clear and Coherent  Affect:  Appropriate  Mood:  anxious and better  Thought process:  normal  Thought content:    WNL   Sensory/Perceptual disturbances:    WNL  Orientation:  Fully oriented  Attention:  Good    Concentration:  Good  Memory:  WNL  Insight:    Good  Judgment:   Fair  Impulse Control:  Fair   Risk Assessment: Danger to Self: No Self-injurious Behavior: No Danger to Others: No Physical Aggression / Violence: No Duty to Warn: No Access to Firearms a concern: No  Assessment of progress:  progressing  Diagnosis: No diagnosis found. Plan:  . *** . Other recommendations/advice as may be noted above . Continue to utilize previously learned skills ad lib . Maintain medication as prescribed and work faithfully with relevant prescriber(s) if any changes are desired or seem indicated . Call the clinic on-call service, present to ER, or call 911 if any life-threatening psychiatric crisis . No follow-ups on file. . Already scheduled visit in this office Visit date not found.  Robley Fries, PhD [name], PhD LP Clinical Psychologist, [facility] [address], [address] [place], [place] [address] [phone]

## 2020-06-06 NOTE — Progress Notes (Signed)
Psychotherapy Progress Note [facility] [name], PhD LP  Patient ID: Amy Spence     MRN: [id number] Therapy format: Individual psychotherapy Date: 06/06/2020      Start: 2:22p     Stop: 3:10p     Time Spent: 48 min Location: In-person   Session narrative (presenting needs, interim history, self-report of stressors and symptoms, applications of prior therapy, status changes, and interventions made in session) Has had TKR on right knee, glad to be out of joint pain, dealing with postsurgical discomfort, doing PT 3/wk.  Delayed by COVID test initially.  Optimistic about further recovery, weaning pain meds.  Disappointed that she has been gaining weight in the meantime.  2 months out from last weight program visit, 3 from therapy.    Discussed tactics for food control, e.g., reflexively getting some white cheddar popcorn or another snack in front of the TV at night.  Current strategy, given by [facility] weight loss center, is to eat 5 times a day in a 16-hour window and keep overall calorie count below a ceiling number.  Discussed this and other nutritional ideas, along with her patterns, and determined she is highly likely to have insulin resistance and highly conditioned snack behavior as important factors in her problem.  While portion control and calorie count are important and can be very helpful, educated on the role of what kind of calories and timing as well as typical settings for overeating and emotional environment.  Advocated a carb control approach popularized by the [employer] for carbohydrate addiction, in which carbs are mainly sequestered to 1 hour of the day and eaten on top of noncarb or low-carb foods and what is called the "reward meal".  Educated about deconditioning pancreatic response and cellular reactions to insulin.  Also noted conditioned response with TV and lowered resistance in the evening after becoming more neurologically tired during the  day.  Suggested a narrow order window, if attainable, for example, a 12-hour period from about 8 AM to 8 PM and at least to 2 and hopefully 3-hour buffer between last food and sleep.  Educated that sleep is more restful unsatisfying and conducive to weight loss if one goes to bed a little bit hungry.  As for eating behavior, acknowledged that there will be times where she breaks protocol and snacks, most likely.  For morale sake, it is important to have partial victories she can obtain in all facets of her eating program, be they calorie control, portion control, timing, avoidance of binge foods, or other variables.  Introduced the approach of mindful eating as a form of behavioral flexibility and a form of self soothing in the midst of eating, so that food itself does not gain power as a sedative.  In particular, advocated that she take a desirable snack food, get away from the TV, and pay full attention to that food item, trying to save her it with all 5 senses.  In the case of white cheddar popcorn, for instance, that would mean looking over all sides of a puff, seeing the contours and colors, smelling it, turning it over between your fingers to feel the contours, rubbing fingers on it to hear what it sounds like, then letting it roll around your mouth so your tongue can feel it, too, and feeling and tasting what happens as it is broken down by the teeth before swallowing.  Provided the slogan, "DO play with your food" as a way to remember it.  Inspired by the ideas and enthusiastic to try them.  Recommended continue measuring, food curfew   Therapeutic modalities: Cognitive Behavioral Therapy, Solution-Oriented/Positive Psychology and Psycho-education/Bibliotherapy  Mental Status/Observations:  Appearance:   Casual     Behavior:  Appropriate  Motor:  Normal  Speech/Language:   Clear and Coherent  Affect:  Appropriate  Mood:  anxious and better  Thought process:  normal  Thought content:    WNL   Sensory/Perceptual disturbances:    WNL  Orientation:  Fully oriented  Attention:  Good    Concentration:  Good  Memory:  WNL  Insight:    Good  Judgment:   Fair  Impulse Control:  Fair   Risk Assessment: Danger to Self: No Self-injurious Behavior: No Danger to Others: No Physical Aggression / Violence: No Duty to Warn: No Access to Firearms a concern: No  Assessment of progress:  progressing  Diagnosis:   ICD-10-CM   1. Generalized anxiety disorder with panic attacks  F41.1    F41.0   2. Compulsive overeating  F50.89   3. S/P total knee arthroplasty, right  Z96.651    Plan:  . Continue to follow medical and physical therapy advice for recovery from TKR . For compulsive overeating, apply principles above -- carb control and timing, mindful eating --and incorporate with current recommendations.  Consider shortening the window for overall food intake and ensure adequate allocation of fat calories for brain health and ketogenic balance. . Other recommendations/advice as may be noted above . Continue to utilize previously learned skills ad lib . Maintain medication as prescribed and work faithfully with relevant prescriber(s) if any changes are desired or seem indicated . Call the clinic on-call service, present to ER, or call 911 if any life-threatening psychiatric crisis Return in about 1 month (around 07/07/2020). . Already scheduled visit in this office Visit date not found.  Robley Fries, PhD [name], PhD LP Clinical Psychologist, [facility] [address], [address] [place], [place] [address] [phone]

## 2020-07-07 ENCOUNTER — Ambulatory Visit: Payer: Medicare Other | Admitting: Psychiatry

## 2020-07-24 ENCOUNTER — Ambulatory Visit (INDEPENDENT_AMBULATORY_CARE_PROVIDER_SITE_OTHER): Payer: MEDICARE | Admitting: Psychiatry

## 2020-07-24 ENCOUNTER — Other Ambulatory Visit: Payer: Self-pay

## 2020-07-24 DIAGNOSIS — Z96651 Presence of right artificial knee joint: Secondary | ICD-10-CM | POA: Diagnosis not present

## 2020-07-24 DIAGNOSIS — F411 Generalized anxiety disorder: Secondary | ICD-10-CM | POA: Diagnosis not present

## 2020-07-24 DIAGNOSIS — F909 Attention-deficit hyperactivity disorder, unspecified type: Secondary | ICD-10-CM | POA: Diagnosis not present

## 2020-07-24 DIAGNOSIS — F341 Dysthymic disorder: Secondary | ICD-10-CM

## 2020-07-24 DIAGNOSIS — F410 Panic disorder [episodic paroxysmal anxiety] without agoraphobia: Secondary | ICD-10-CM

## 2020-07-24 NOTE — Progress Notes (Signed)
Psychotherapy Progress Note [facility] [name], PhD LP  Patient ID: Amy Spence     MRN: [id number] Therapy format: Individual psychotherapy Date: 07/24/2020      Start: 2:12p     Stop: 2:59p     Time Spent: 47 min Location: In-person   Session narrative (presenting needs, interim history, self-report of stressors and symptoms, applications of prior therapy, status changes, and interventions made in session) Sinus infection of late, PCP decided not to test for COVID.  Moving well since TKR, but c/o burning pain inside knee, attributed to nerves regenerating around knee.  Now seeing left hip aggravated some.    Now feeling like not going out places, either.  Feels like the string of losses, including old friend Cleo, the 2018-2019 depression, being self-conscious about "hanging skin" all inhibit her.  And fear of reinjury.  Won't go to gynecologist.  Self-conscious, both about body and behavior.  Will defer reading aloud in Sunday School.  Blames herself for the state she's in.    Reframed as self-consciousness, prompted well enough by having been blamed in life but challenged to recognize the good hearts of people she would encounter.  Reframed how she has worked hard to recover, lose weight, weather the pandemic, etc., and deserves the opportunity to let herself back into life as she wants it.  The price is coping with imperfections.  Reminded her that her doctor is clinical and will not care one bit about her skin except insofar as empathizing with her own feelings.  Showed picture of a relative of mine for inspiration, an aunt noted for just putting herself out there regardless of appearance, and encouraged her to go with it for her own good.  Therapeutic modalities: Cognitive Behavioral Therapy, Solution-Oriented/Positive Psychology, and Ego-Supportive  Mental Status/Observations:  Appearance:   Casual     Behavior:  Appropriate  Motor:  Normal   Speech/Language:   Clear and Coherent  Affect:  Appropriate  Mood:  anxious and depressed  Thought process:  normal  Thought content:    Rumination  Sensory/Perceptual disturbances:    WNL  Orientation:  Fully oriented  Attention:  Good    Concentration:  Fair  Memory:  WNL  Insight:    Good  Judgment:   Good  Impulse Control:  Fair   Risk Assessment: Danger to Self: No Self-injurious Behavior: No Danger to Others: No Physical Aggression / Violence: No Duty to Warn: No Access to Firearms a concern: No  Assessment of progress:  stabilized  Diagnosis:   ICD-10-CM   1. Dysthymia  F34.1     2. Generalized anxiety disorder with panic attacks  F41.1    F41.0     3. S/P total knee arthroplasty, right  Z96.651     4. Attention deficit hyperactivity disorder (ADHD), unspecified ADHD type -- by hx  F90.9      Plan:  Apply coping thoughts to physical visits and social anxiety and seek to show up anyway Continue with any recommended physical therapy and nutritional advice Other recommendations/advice as may be noted above Continue to utilize previously learned skills ad lib Maintain medication as prescribed and work faithfully with relevant prescriber(s) if any changes are desired or seem indicated Call the clinic on-call service, present to ER, or call 911 if any life-threatening psychiatric crisis Return in about 1 month (around 08/24/2020). Already scheduled visit in this office Visit date not found.  Robley Fries, PhD [name], PhD LP Clinical Psychologist,  [facility] [address] [place], [place] [address] [phone]

## 2020-08-24 ENCOUNTER — Ambulatory Visit (INDEPENDENT_AMBULATORY_CARE_PROVIDER_SITE_OTHER): Payer: MEDICARE | Admitting: Psychiatry

## 2020-08-24 ENCOUNTER — Other Ambulatory Visit: Payer: Self-pay

## 2020-08-24 DIAGNOSIS — M549 Dorsalgia, unspecified: Secondary | ICD-10-CM

## 2020-08-24 DIAGNOSIS — F411 Generalized anxiety disorder: Secondary | ICD-10-CM | POA: Diagnosis not present

## 2020-08-24 DIAGNOSIS — F5089 Other specified eating disorder: Secondary | ICD-10-CM | POA: Diagnosis not present

## 2020-08-24 DIAGNOSIS — M797 Fibromyalgia: Secondary | ICD-10-CM

## 2020-08-24 DIAGNOSIS — F341 Dysthymic disorder: Secondary | ICD-10-CM

## 2020-08-24 DIAGNOSIS — Z638 Other specified problems related to primary support group: Secondary | ICD-10-CM

## 2020-08-24 DIAGNOSIS — F410 Panic disorder [episodic paroxysmal anxiety] without agoraphobia: Secondary | ICD-10-CM

## 2020-08-24 DIAGNOSIS — F909 Attention-deficit hyperactivity disorder, unspecified type: Secondary | ICD-10-CM
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
## 2020-09-21 ENCOUNTER — Other Ambulatory Visit: Payer: Self-pay

## 2020-09-21 ENCOUNTER — Ambulatory Visit (INDEPENDENT_AMBULATORY_CARE_PROVIDER_SITE_OTHER): Payer: MEDICARE | Admitting: Psychiatry

## 2020-09-21 DIAGNOSIS — F411 Generalized anxiety disorder: Secondary | ICD-10-CM | POA: Diagnosis not present

## 2020-09-21 DIAGNOSIS — Z638 Other specified problems related to primary support group: Secondary | ICD-10-CM | POA: Diagnosis not present

## 2020-09-21 DIAGNOSIS — F341 Dysthymic disorder: Secondary | ICD-10-CM | POA: Diagnosis not present

## 2020-09-21 DIAGNOSIS — F410 Panic disorder [episodic paroxysmal anxiety] without agoraphobia: Secondary | ICD-10-CM

## 2020-09-21 DIAGNOSIS — F5089 Other specified eating disorder: Secondary | ICD-10-CM | POA: Diagnosis not present

## 2020-09-21 NOTE — Progress Notes (Signed)
Psychotherapy Progress Note [facility] [name], PhD LP  Patient ID: Amy Spence     MRN: [id number] Therapy format: Individual psychotherapy Date: 09/21/2020      Start: 2:13p     Stop: 3:03p     Time Spent: 50 min Location: In-person   Session narrative (presenting needs, interim history, self-report of stressors and symptoms, applications of prior therapy, status changes, and interventions made in session) Mother stabilized and doing pretty well at the nursing home in [place], in good spirits.  Clearly the right move getting her the stable care they can provide.  Sister, and to some extent brother, keep asking conspicuously whether she's talked to M, in guilting manner.  Long family habit to do that, tends to make her feel like a bad daughter, but she has 4 grandchildren, her own family life and health to look after, and she is coming out of a couple years' depression.  It has been 4 months since the last trip to see her, but it is a half-day journey to get there and hard to do as a day trip or overnighter.  Affirmed she does not have to be making 3/day calls or weekly visits to qualify as a good daughter, validated that she is appropriately interested and involved.  Discussed ways to counter emotional manipulation, esp. by sister.  Victory over social anxiety in making her gynecology appt (8/8), braving the threat of feeling grotesque in front of her female physician.  Would like to deal with loose skin, feels disfigured, but has already looked into what plastic surgery would cost, and risk, and is willing to live with it.   Set to go to the gym 3/wk to further weight loss, tone muscle, and slim down, sober about the fact there will just be loose skin.  Encouraged, with option to disclose to her doctor that she brings anxiety to it.  Told story of an aunt of mine for inspiration in adopting a "big deal" attitude.  Still trying to break out socially, grow more of a personal  support system after losing familiar church and having been shut in more for pandemic.  Plans lunch with pastor's wife, a generation younger and recently widowed.  Prepared ways to break the ice with Raynelle Fanning and to relax fear of awkwardness.  Therapeutic modalities: Cognitive Behavioral Therapy, Solution-Oriented/Positive Psychology, and Psychologist, occupational  Mental Status/Observations:  Appearance:   Casual     Behavior:  Appropriate  Motor:  Normal  Speech/Language:   Clear and Coherent  Affect:  Appropriate  Mood:  Apprehensive but brighter  Thought process:  normal  Thought content:    worry  Sensory/Perceptual disturbances:    WNL  Orientation:  Fully oriented  Attention:  Good    Concentration:  Good  Memory:  WNL  Insight:    Good  Judgment:   Good  Impulse Control:  Good   Risk Assessment: Danger to Self: No Self-injurious Behavior: No Danger to Others: No Physical Aggression / Violence: No Duty to Warn: No Access to Firearms a concern: No  Assessment of progress:  progressing  Diagnosis:   ICD-10-CM   1. Generalized anxiety disorder with panic attacks  F41.1    F41.0     2. Relationship problem with family member (sister)  Z6.8     3. Compulsive overeating  F50.89     4. Dysthymia  F34.1      Plan:  Encourage be willing to mention to physician that  she is anxious about being there and about her appearance Follow through on physical fitness efforts and practice acceptance of loose skin Develop a few "interview" questions and icebreakers for Raynelle Fanning, and as needed mentally practice being ludicrously inappropriate Other recommendations/advice as may be noted above Continue to utilize previously learned skills ad lib Maintain medication as prescribed and work faithfully with relevant prescriber(s) if any changes are desired or seem indicated Call the clinic on-call service, present to ER, or call 911 if any life-threatening psychiatric crisis Return in about 1  month (around 10/22/2020) for session(s) already scheduled. Already scheduled visit in this office 10/19/2020.  Robley Fries, PhD [name], PhD LP Clinical Psychologist, [facility] [address], [address] [place], [place] [address] [phone]

## 2020-10-19 ENCOUNTER — Other Ambulatory Visit: Payer: Self-pay

## 2020-10-19 ENCOUNTER — Ambulatory Visit (INDEPENDENT_AMBULATORY_CARE_PROVIDER_SITE_OTHER): Payer: MEDICARE | Admitting: Psychiatry

## 2020-10-19 DIAGNOSIS — F5089 Other specified eating disorder: Secondary | ICD-10-CM

## 2020-10-19 DIAGNOSIS — F411 Generalized anxiety disorder: Secondary | ICD-10-CM

## 2020-10-19 DIAGNOSIS — F341 Dysthymic disorder: Secondary | ICD-10-CM

## 2020-10-19 DIAGNOSIS — E678 Other specified hyperalimentation: Secondary | ICD-10-CM

## 2020-10-19 DIAGNOSIS — F410 Panic disorder [episodic paroxysmal anxiety] without agoraphobia: Secondary | ICD-10-CM

## 2020-10-19 DIAGNOSIS — M797 Fibromyalgia: Secondary | ICD-10-CM

## 2020-10-19 NOTE — Progress Notes (Signed)
Psychotherapy Progress Note [facility] [name], PhD LP  Patient ID: SOPHIA SPERRY     MRN: [id number] Therapy format: Individual psychotherapy Date: 10/19/2020      Start: 2:15p     Stop: 3:02p     Time Spent: 47 min Location: In-person   Session narrative (presenting needs, interim history, self-report of stressors and symptoms, applications of prior therapy, status changes, and interventions made in session) Came through gynecology apt well, helped to reveal her own anxiety.  Otherwise feels she's been in some depression.  Shades drawn a lot.  Weight going up with carb loading, spent 3 days in bed before her gynecology appt.  Feels she may be getting frequent sinus infections, will see PCP soon.  Some balance problems, possibly tied to fluid in ears.  Has started putting 3 packs of Stevia in coffee.  Has started eating a protein bar for breakfast, one for lunch, then Healthy Choice for dinner.  Has abandoned salads and vegetables.  Forgot about MCT oil.  Reminded it will work as a craving and "fog" breaker if theory of insulin resistance is true.  Continue to recommend relatively lengthy fasting, at least 12 hours.  Advocated return to salads, greens, cooking, and if it feels a little difficult, cook mindfully.    Therapeutic modalities: Cognitive Behavioral Therapy and Solution-Oriented/Positive Psychology  Mental Status/Observations:  Appearance:   Casual     Behavior:  Appropriate  Motor:  Normal  Speech/Language:   Clear and Coherent  Affect:  Appropriate  Mood:  anxious and depressed  Thought process:  normal  Thought content:    Rumination  Sensory/Perceptual disturbances:    WNL  Orientation:  Fully oriented  Attention:  Good    Concentration:  Fair  Memory:  WNL  Insight:    Fair  Judgment:   Good  Impulse Control:  Fair   Risk Assessment: Danger to Self: No Self-injurious Behavior: No Danger to Others: No Physical Aggression / Violence:  No Duty to Warn: No Access to Firearms a concern: No  Assessment of progress:  situational setback  Diagnosis:   ICD-10-CM   1. Generalized anxiety disorder with panic attacks  F41.1    F41.0     2. Compulsive overeating  F50.89     3. Dysthymia  F34.1     4. Excessive carbohydrate intake  E67.8     5. Fibromyalgia  M79.7      Plan:  Enact dietary changes for carb control Get more light, fight the urge to draw shades and shut in Seek other stimulation -- activity, interactions -- to get out of rumination Self-affirm good work done facing body anxiety at gynecologist Other recommendations/advice as may be noted above Continue to utilize previously learned skills ad lib Maintain medication as prescribed and work faithfully with relevant prescriber(s) if any changes are desired or seem indicated Call the clinic on-call service, present to ER, or call 911 if any life-threatening psychiatric crisis Return for time as available. Already scheduled visit in this office 11/16/2020.  Robley Fries, PhD [name], PhD LP Clinical Psychologist, [facility] [address], [address] [place], [place] [address] [phone]

## 2020-11-16 ENCOUNTER — Ambulatory Visit (INDEPENDENT_AMBULATORY_CARE_PROVIDER_SITE_OTHER): Payer: MEDICARE | Admitting: Psychiatry

## 2020-11-16 ENCOUNTER — Other Ambulatory Visit: Payer: Self-pay

## 2020-11-16 DIAGNOSIS — F411 Generalized anxiety disorder: Secondary | ICD-10-CM

## 2020-11-16 DIAGNOSIS — E678 Other specified hyperalimentation: Secondary | ICD-10-CM | POA: Diagnosis not present

## 2020-11-16 DIAGNOSIS — F341 Dysthymic disorder: Secondary | ICD-10-CM | POA: Diagnosis not present

## 2020-11-16 DIAGNOSIS — F5089 Other specified eating disorder: Secondary | ICD-10-CM | POA: Diagnosis not present

## 2020-11-16 DIAGNOSIS — F410 Panic disorder [episodic paroxysmal anxiety] without agoraphobia: Secondary | ICD-10-CM

## 2020-11-16 DIAGNOSIS — M797 Fibromyalgia: Secondary | ICD-10-CM

## 2020-11-16 DIAGNOSIS — F909 Attention-deficit hyperactivity disorder, unspecified type: Secondary | ICD-10-CM
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
## 2020-12-14 ENCOUNTER — Ambulatory Visit: Payer: MEDICARE | Admitting: Psychiatry

## 2020-12-28 ENCOUNTER — Other Ambulatory Visit: Payer: Self-pay

## 2020-12-28 ENCOUNTER — Ambulatory Visit (INDEPENDENT_AMBULATORY_CARE_PROVIDER_SITE_OTHER): Payer: MEDICARE | Admitting: Psychiatry

## 2020-12-28 DIAGNOSIS — M797 Fibromyalgia: Secondary | ICD-10-CM

## 2020-12-28 DIAGNOSIS — E678 Other specified hyperalimentation: Secondary | ICD-10-CM | POA: Diagnosis not present

## 2020-12-28 DIAGNOSIS — F410 Panic disorder [episodic paroxysmal anxiety] without agoraphobia: Secondary | ICD-10-CM

## 2020-12-28 DIAGNOSIS — F5089 Other specified eating disorder: Secondary | ICD-10-CM | POA: Diagnosis not present

## 2020-12-28 DIAGNOSIS — F411 Generalized anxiety disorder: Secondary | ICD-10-CM

## 2020-12-28 DIAGNOSIS — F341 Dysthymic disorder: Secondary | ICD-10-CM | POA: Diagnosis not present

## 2020-12-28 DIAGNOSIS — F909 Attention-deficit hyperactivity disorder, unspecified type: Secondary | ICD-10-CM

## 2020-12-28 NOTE — Progress Notes (Signed)
Psychotherapy Progress Note [facility] [name], PhD LP  Patient ID: Amy Spence)    MRN: [id number] Therapy format: Individual psychotherapy Date: 12/28/2020      Start: 2:12p     Stop: 3:02p     Time Spent: 50 min Location: In-person   Session narrative (presenting needs, interim history, self-report of stressors and symptoms, applications of prior therapy, status changes, and interventions made in session) Weight loss specialist has recommended more regular therapy, to address self-esteem and anxiety and depression and being so tired.  C/o hip pain, knee pain, knows she has bursitis in hip.  Knows she has habits of night eating and anxiety eating.  Has diabetic protein bars at home, and small packs of nuts.  Was eating a lot of Healthy Choice bowls while losing weight, but now  Supposed to adhere to 1000-1050 calories and Q 3 hrs eating.  Some days will hit 1300 cal, but there are days she just doesn't measure.  Concerns some evenings she'll load up on snacks a while then feel like sleeping in in the morning.  Discussed alternative interests, things to do with boredom.  Has a greenhouse in preparation, and would like to start sewing totes for a [employer].  Some TV would be of interest, would just make it more tempting to eat.  Has some painting supplies she can get out and some videos online.  Recommend "success journaling" where possible, but be fully honest about what she tries and how it turns out, and slips..  Wants to work Q 2 wks in therapy, focus on weight behavior management.  Agreed.  Therapeutic modalities: Cognitive Behavioral Therapy and Solution-Oriented/Positive Psychology  Mental Status/Observations:  Appearance:   Casual     Behavior:  Appropriate  Motor:  Normal  Speech/Language:   Clear and Coherent  Affect:  Appropriate  Mood:  depressed and embarrassed  Thought process:  normal and some flight  Thought content:     Worry, self-judgment  Sensory/Perceptual disturbances:    WNL  Orientation:  Fully oriented  Attention:  Good    Concentration:  Fair  Memory:  WNL  Insight:    Fair  Judgment:   Good  Impulse Control:  Fair   Risk Assessment: Danger to Self: No Self-injurious Behavior: No Danger to Others: No Physical Aggression / Violence: No Duty to Warn: No Access to Firearms a concern: No  Assessment of progress:  progressing  Diagnosis:   ICD-10-CM   1. Generalized anxiety disorder with panic attacks  F41.1    F41.0     2. Dysthymia  F34.1     3. Compulsive overeating  F50.89     4. Excessive carbohydrate intake  E67.8     5. Attention deficit hyperactivity disorder (ADHD), unspecified ADHD type -- by hx  F90.9     6. Fibromyalgia  M79.7      Plan:  Daily journal of food behavior -- struggles and any partial victories, and especially  affirm any time did something other than snack and it felt OK If logging food, be fully honest, don't leave anything out Develop "menu" of things can do instead of munch -- painting, Youtube, greenhouse, walk, crochet, even TV -- 15-30 minutes before making any decision to snack For feeling draggy, hitting the wall -- 1 tbsp MCT oil + 15 min Consider higher calorie target, e.g., 1200, and work for stability at that before trying to go lower Restart omega 3 Other recommendations/advice as  may be noted above Continue to utilize previously learned skills ad lib Maintain medication as prescribed and work faithfully with relevant prescriber(s) if any changes are desired or seem indicated Call the clinic on-call service, 988/hotline, present to ER, or call 911 if any life-threatening psychiatric crisis Return in about 2 weeks (around 01/11/2021) for time as available. Already scheduled visit in this office 01/11/2021.  Robley Fries, PhD [name], PhD LP Clinical Psychologist, [facility] [address] [place], [place] [address] [phone]

## 2021-01-11 ENCOUNTER — Ambulatory Visit (INDEPENDENT_AMBULATORY_CARE_PROVIDER_SITE_OTHER): Payer: MEDICARE | Admitting: Psychiatry

## 2021-01-11 ENCOUNTER — Other Ambulatory Visit: Payer: Self-pay

## 2021-01-11 DIAGNOSIS — M797 Fibromyalgia: Secondary | ICD-10-CM

## 2021-01-11 DIAGNOSIS — F411 Generalized anxiety disorder: Secondary | ICD-10-CM | POA: Diagnosis not present

## 2021-01-11 DIAGNOSIS — F341 Dysthymic disorder: Secondary | ICD-10-CM

## 2021-01-11 DIAGNOSIS — F410 Panic disorder [episodic paroxysmal anxiety] without agoraphobia: Secondary | ICD-10-CM

## 2021-01-11 DIAGNOSIS — F5089 Other specified eating disorder: Secondary | ICD-10-CM
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
## 2021-01-25 ENCOUNTER — Other Ambulatory Visit: Payer: Self-pay

## 2021-01-25 ENCOUNTER — Ambulatory Visit (INDEPENDENT_AMBULATORY_CARE_PROVIDER_SITE_OTHER): Payer: MEDICARE | Admitting: Psychiatry

## 2021-01-25 DIAGNOSIS — M797 Fibromyalgia: Secondary | ICD-10-CM

## 2021-01-25 DIAGNOSIS — E678 Other specified hyperalimentation: Secondary | ICD-10-CM | POA: Diagnosis not present

## 2021-01-25 DIAGNOSIS — F411 Generalized anxiety disorder: Secondary | ICD-10-CM

## 2021-01-25 DIAGNOSIS — F341 Dysthymic disorder: Secondary | ICD-10-CM | POA: Diagnosis not present

## 2021-01-25 DIAGNOSIS — F5089 Other specified eating disorder: Secondary | ICD-10-CM

## 2021-01-25 DIAGNOSIS — F410 Panic disorder [episodic paroxysmal anxiety] without agoraphobia: Secondary | ICD-10-CM

## 2021-01-25 DIAGNOSIS — F909 Attention-deficit hyperactivity disorder, unspecified type: Secondary | ICD-10-CM

## 2021-01-25 NOTE — Progress Notes (Signed)
Psychotherapy Progress Note [facility] [name], PhD LP  Patient ID: Amy Spence)    MRN: [id number] Therapy format: Individual psychotherapy Date: 01/25/2021      Start: 3:10p     Stop: 4:00p     Time Spent: 50 min Location: In-person   Session narrative (presenting needs, interim history, self-report of stressors and symptoms, applications of prior therapy, status changes, and interventions made in session) Has changed strategy for food and is now on the GOLO program.  Likes how it prescreened her medication conflicts, maps out food choices, and is helping her get off addictive and ineffective chocolate protein shakes.  It also has some guidance about bread/wheat that is working.  Feels good about the plan and early results, and it is helping manage appetite, which is a blessing.    Worries about whether her nutritionist will be "mad" about her changing plans.  Assured her nutritionist will be professional enough not to take it personally, just work with her, as promised.  If not, then maybe she is not as professional as hoped.  Has been working at slowing down and enjoying her food, with some success.  Discussed heathy choices for next week, e.g., veggie trays, lean dairy.  Embarrassed, but needs to acknowledge that she has had binges while working these.  Accepted and rededicated to the work.  Reminded of OA as an option for support of people who have known and faced the same.  Therapeutic modalities: Cognitive Behavioral Therapy and Solution-Oriented/Positive Psychology  Mental Status/Observations:  Appearance:   Casual     Behavior:  Appropriate  Motor:  Normal  Speech/Language:   Clear and Coherent  Affect:  Appropriate  Mood:  anxious and dysthymic  Thought process:  normal  Thought content:    Self-conscious worry  Sensory/Perceptual disturbances:    WNL  Orientation:  Fully oriented  Attention:  Good    Concentration:  Fair   Memory:  WNL  Insight:    Fair  Judgment:   Good  Impulse Control:  Good   Risk Assessment: Danger to Self: No Self-injurious Behavior: No Danger to Others: No Physical Aggression / Violence: No Duty to Warn: No Access to Firearms a concern: No  Assessment of progress:  progressing  Diagnosis:   ICD-10-CM   1. Generalized anxiety disorder with panic attacks  F41.1    F41.0     2. Compulsive overeating  F50.89     3. Dysthymia  F34.1     4. Excessive carbohydrate intake  E67.8     5. Attention deficit hyperactivity disorder (ADHD), unspecified ADHD type -- by hx  F90.9     6. Fibromyalgia  M79.7      Plan:  For boundaries and family pressures: Self-affirm OK to say her own yes or not to any time, interactions Watch own tendency to resent and release as able For food behavior and weight loss: OK with GOLO program -- monitor for emotional disruption from hormonal intervention and report promptly Continue to practice mindful eating, moments at least of slowing down to notice food sensations Daily journal of food behavior -- struggles and any partial victories, and especially  affirm any time did something other than snack and it felt OK.  If logging food, be fully honest, don't leave anything out. Develop "menu" of things can do instead of munch -- painting, Youtube, greenhouse, walk, crochet, even TV -- 15-30 minutes before making any decision to snack Take own food to  church dinners Make a habit of eating non-carbs first Slow down and enjoy the yummies she does get For feeling draggy, hitting the wall -- 1 tbsp MCT oil + 15 min Consider higher calorie target, e.g., 1200, and work for stability at that before trying to go lower Omega 3 for antiinflammatory benefit  Continuing option specialty weight loss clinic [employer] for support and morale For self-esteem and mood: Self-edit calling herself stupid, etc Self-affirm having successfully gone to  physical exams options for stress -- get hands busy, speak her feelings, vocalize/scream, clench hands 90 seconds, various crafts (crochet, greenhouse, others) Exercise as tolerated stretch back and legs for sciatic help Other recommendations/advice as may be noted above Continue to utilize previously learned skills ad lib Maintain medication as prescribed and work faithfully with relevant prescriber(s) if any changes are desired or seem indicated Call the clinic on-call service, 988/hotline, 911, or present to [facility] or ER if any life-threatening psychiatric crisis Return in about 1 month (around 02/24/2021) for may de-schedule 12/6 and 12/29 and book mid-January. Already scheduled visit in this office 02/13/2021.  Robley Fries, PhD [name], PhD LP Clinical Psychologist, [facility] [address], [address] [place], [place] [address] [phone]

## 2021-01-31 IMAGING — MR MR BREAST BILAT W/O CM
6 of 9 series · 19 of 48 positions shown · non-contrast
Comparison: Previous examinations, including the bilateral
screening mammogram dated 01/29/2019.

CLINICAL DATA: Findings suspicious for possible bilateral breast
implant rupture on a recent screening mammogram. The patient has
bilateral retroglandular silicone implants that were placed in 9349
in [place], [HOSPITAL]. Family history of breast cancer in a paternal
aunt.

EXAM:
BILATERAL BREAST MRI  WITHOUT CONTRAST
TECHNIQUE: Multiplanar, multisequence MR images of both breasts without
contrast. The exam was used to evaluate implant integrity. Because
of the sequences used and the lack of intravenous contrast, this
study is not diagnostic for breast lesions.

[Series 2: STIR · axial · 4.0mm · 0.70mm/px · z∈[-101,+71]mm · 5 of 44 slices shown (1 of 3)]
[im 1/44]
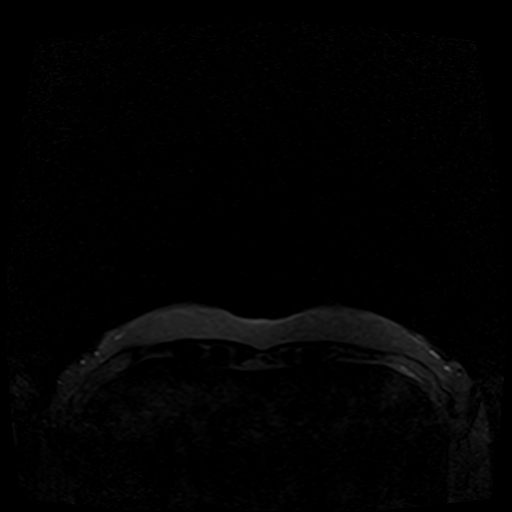
[im 11/44]
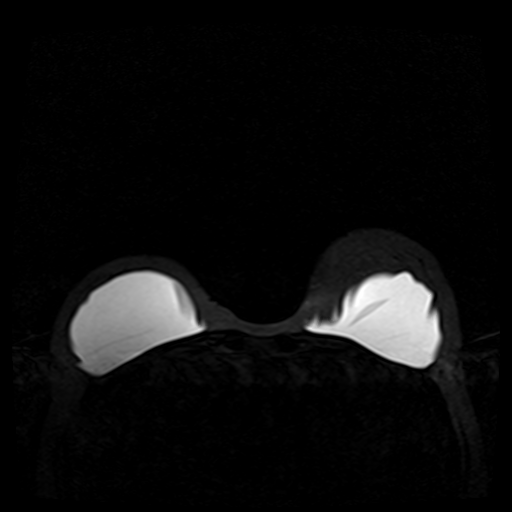
[im 22/44]
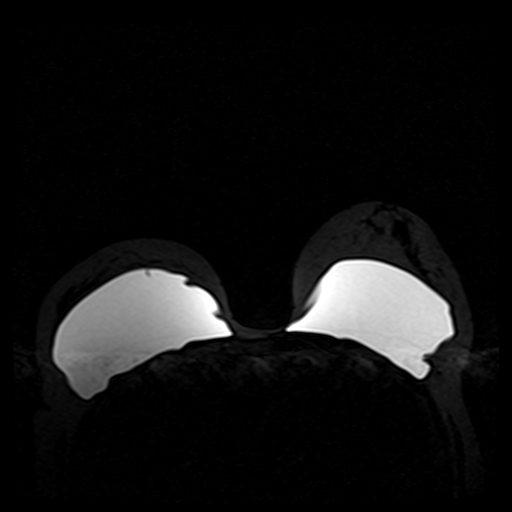
[im 33/44]
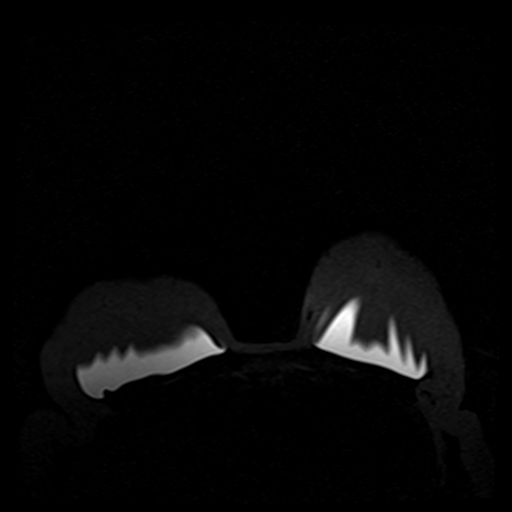
[im 44/44]
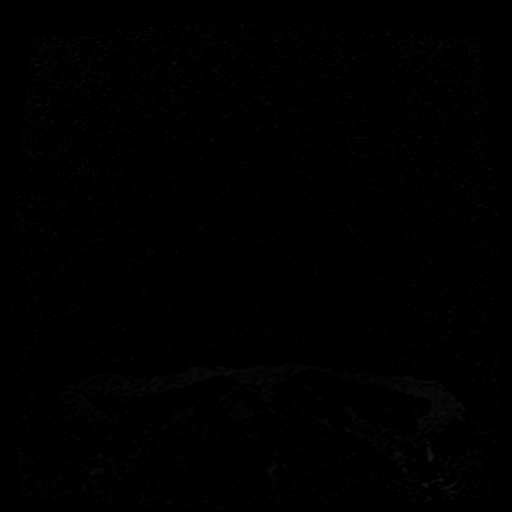

[Series 3: T2 fat-sat · axial · 4.0mm · 0.80mm/px · z∈[-101,+71]mm · 4 of 44 slices shown (1 of 3)]
[im 1/44]
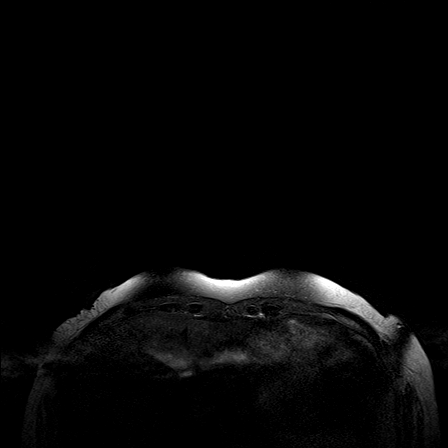
[im 15/44]
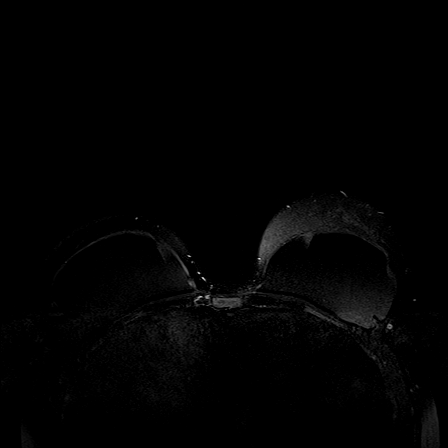
[im 29/44]
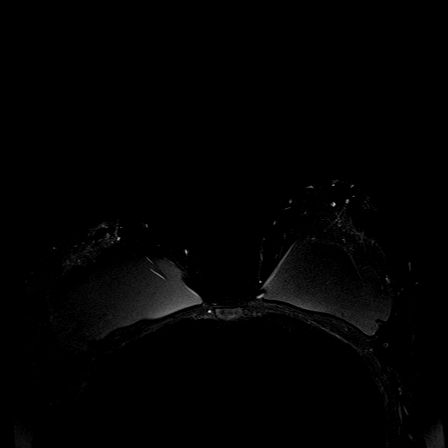
[im 44/44]
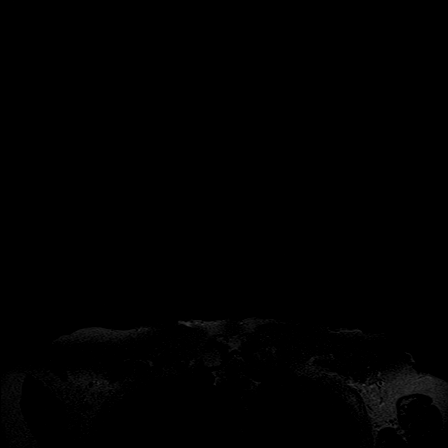

[Series 5: STIR · sagittal · 4.0mm · 0.47mm/px · 3 of 41 slices shown (2 of 3)]
[im 1/41]
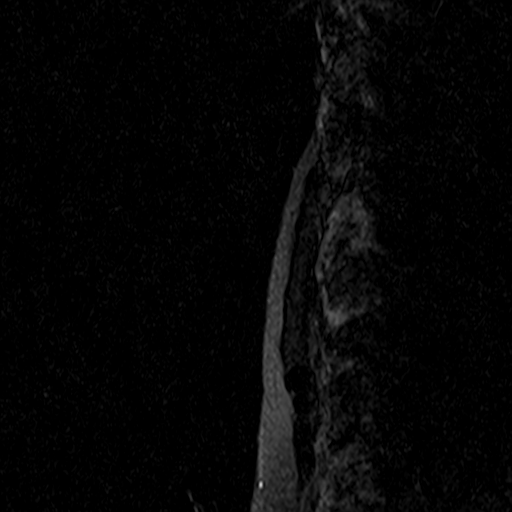
[im 21/41]
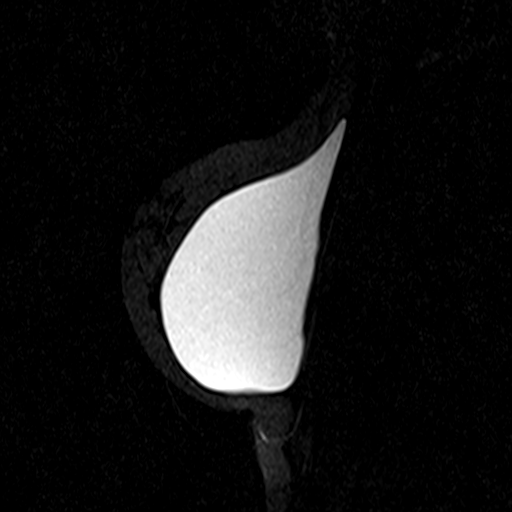
[im 41/41]
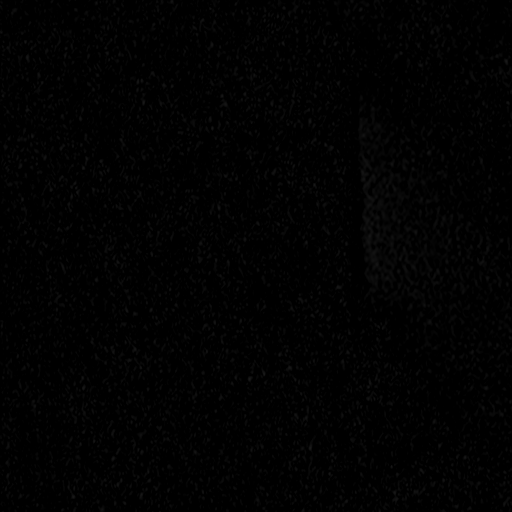

[Series 6: STIR · sagittal · 4.0mm · 0.47mm/px · 3 of 41 slices shown (3 of 3)]
[im 1/41]
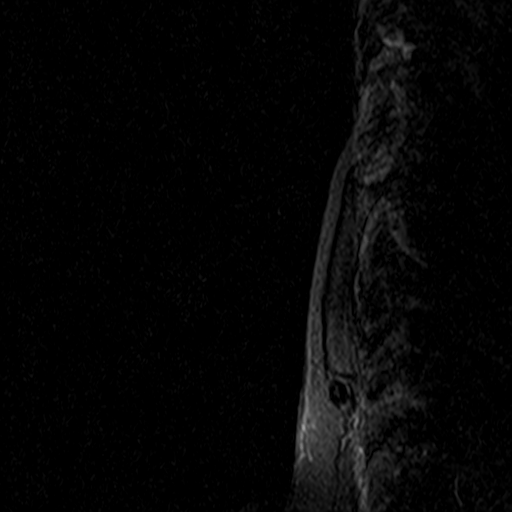
[im 21/41]
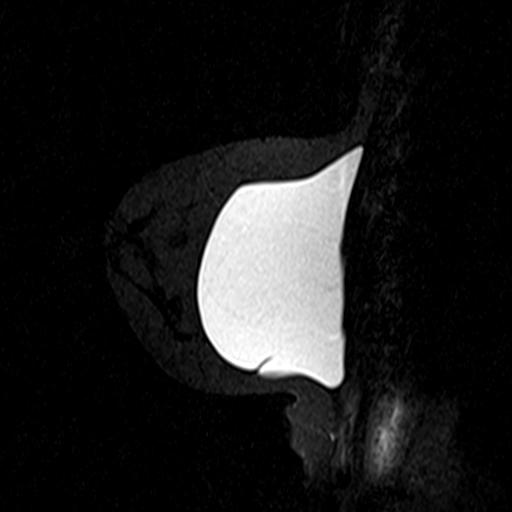
[im 41/41]
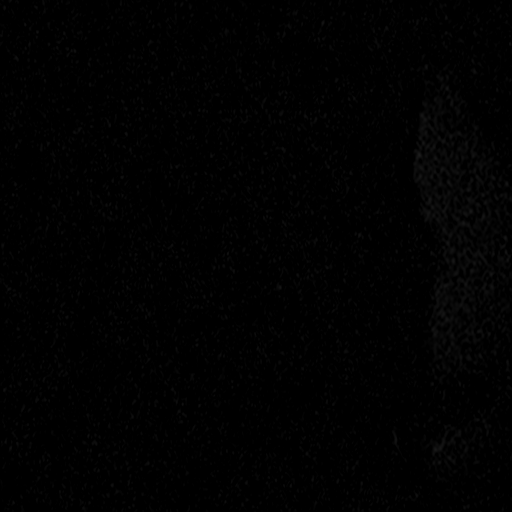

[Series 9: T2 fat-sat · sagittal · 4.0mm · 0.47mm/px · 3 of 41 slices shown (2 of 3)]
[im 1/41]
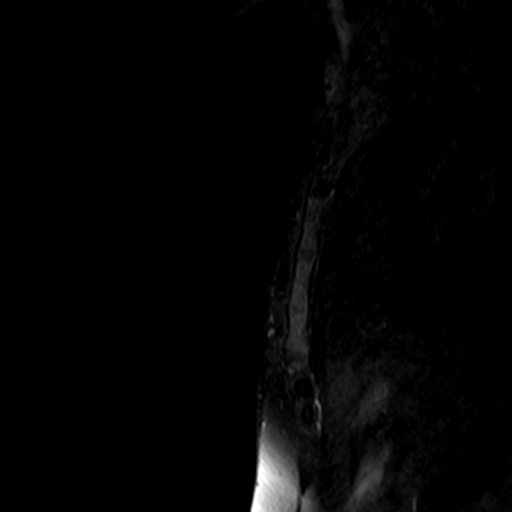
[im 21/41]
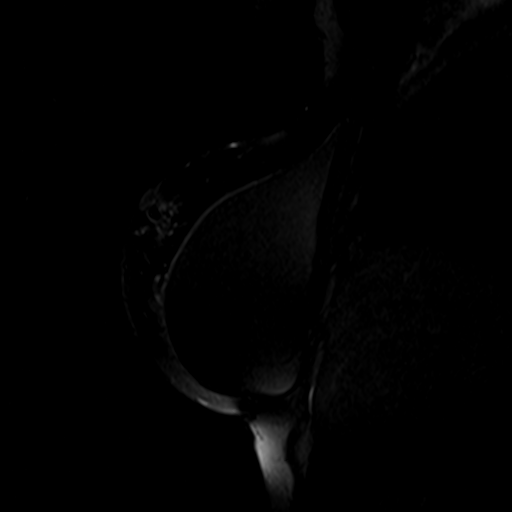
[im 41/41]
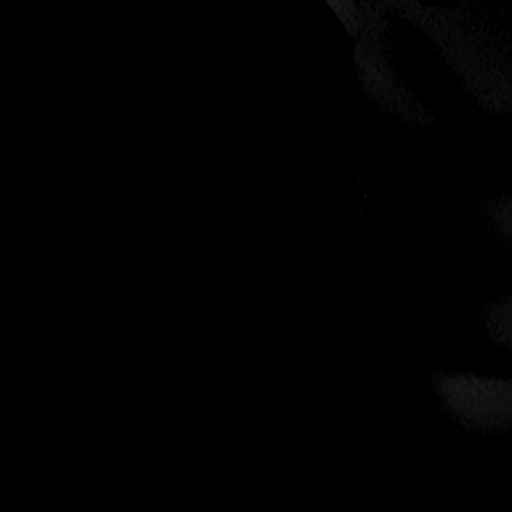

[Series 10: T2 fat-sat · sagittal · 4.0mm · 0.47mm/px · 1 of 41 slices shown (3 of 3)]
[im 1/41]
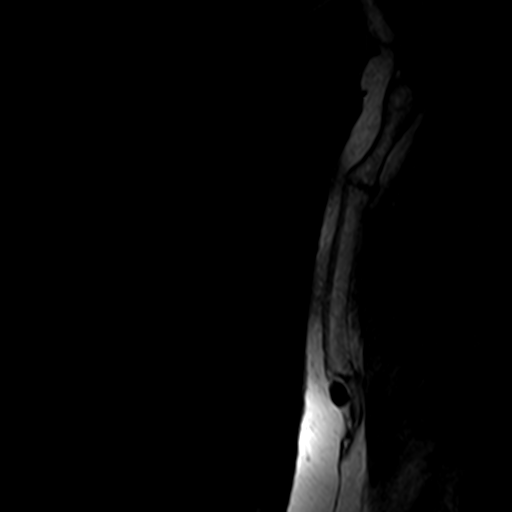

[19 of 48 positions shown; findings below may reference images not displayed]

Three-dimensional MR images were rendered by post-processing of the
original MR data on an independent workstation. The
three-dimensional MR images were interpreted, and findings are
reported in the following complete MRI report for this study. Three
dimensional images were evaluated at the independent [employer]
workstation
FINDINGS: Breast composition: c. Heterogeneous fibroglandular tissue.

Right breast: The retroglandular silicone implant is intact with no
MR evidence of extracapsular or intracapsular implant rupture. No
mass or other findings suspicious for malignancy in the right
breast, limited by the lack of intravenous contrast.

Left breast: The retroglandular silicone implant is intact with no
MR evidence of extracapsular or intracapsular implant rupture.

The 3.3 x 2.7 x 0.9 cm mass seen in the 3 o'clock position of the
left breast at mammography and ultrasound on 04/28/2013 is again
demonstrated. This was subsequently biopsied under ultrasound
guidance with pathology results of fibrocystic changes and
pseudoangiomatous stromal hyperplasia. This is oval and
circumscribed today, with a thin surrounding capsule. This contains
a mixture of tissue, including fatty tissue. This currently measures
2.6 x 1.7 x 1.6 cm.

No mass or other findings suspicious for malignancy in the left
breast, limited by the lack of intravenous contrast.

Ancillary findings: None.
IMPRESSION: 1. The retroglandular silicone breast implants are intact without
intracapsular or extracapsular rupture.
2. Stable previously biopsied benign mass in the 3 o'clock position
of the left breast with MR features compatible with a benign
fibroadenolipoma (hamartoma).

RECOMMENDATION:
Bilateral screening mammogram in 1 year when due.

## 2021-02-13 ENCOUNTER — Ambulatory Visit: Payer: MEDICARE | Admitting: Psychiatry

## 2021-02-19 ENCOUNTER — Ambulatory Visit: Payer: Self-pay | Admitting: Allergy and Immunology

## 2021-02-22 ENCOUNTER — Ambulatory Visit: Payer: MEDICARE | Admitting: Psychiatry

## 2021-03-08 ENCOUNTER — Ambulatory Visit: Payer: MEDICARE | Admitting: Psychiatry

## 2021-03-26 ENCOUNTER — Other Ambulatory Visit: Payer: Self-pay

## 2021-03-26 ENCOUNTER — Ambulatory Visit (INDEPENDENT_AMBULATORY_CARE_PROVIDER_SITE_OTHER): Payer: MEDICARE | Admitting: Psychiatry

## 2021-03-26 DIAGNOSIS — F410 Panic disorder [episodic paroxysmal anxiety] without agoraphobia: Secondary | ICD-10-CM

## 2021-03-26 DIAGNOSIS — F341 Dysthymic disorder: Secondary | ICD-10-CM | POA: Diagnosis not present

## 2021-03-26 DIAGNOSIS — F5089 Other specified eating disorder: Secondary | ICD-10-CM | POA: Diagnosis not present

## 2021-03-26 DIAGNOSIS — F411 Generalized anxiety disorder: Secondary | ICD-10-CM

## 2021-03-26 DIAGNOSIS — F909 Attention-deficit hyperactivity disorder, unspecified type: Secondary | ICD-10-CM | POA: Diagnosis not present

## 2021-03-26 NOTE — Patient Instructions (Signed)
Today get 5-10 minutes outside Practice "just for today" perspective -- no big demands, do sobriety like the 12-step people do Practice making short work of shame -- e.g., you can have 1 minute to blame or shame, then stop and go do something that will actually improve the problem.  Remember -- the beating drives the eating. Work out a deal with Mardelle Matte, as needed, to hold or hide tempting foods  Generally, if facing a binge, do anything else for 15 minutes, especially if it's movement, before deciding whether to eat To break down the tempting nature of chocolate, consider mice and rats in the factory, and the fact that you really don't know whether they got into the chocolate, and you can't tell by looking... Practice noticing and jotting down your feelings 3 times a day -- you can say as much or as little as you rwant about what's on your mind, but name feeling.  Use the feeling words chart to pick a word you don't use so often.

## 2021-03-26 NOTE — Progress Notes (Incomplete)
Psychotherapy Progress Note [facility] [name], PhD LP  Patient ID: Amy Spence)    MRN: [id number] Therapy format: Individual psychotherapy Date: 03/26/2021      Start: 3:11p     Stop: ***:***     Time Spent: *** min Location: In-person   Session narrative (presenting needs, interim history, self-report of stressors and symptoms, applications of prior therapy, status changes, and interventions made in session) Got COVID early December, opportunistic sinus infection after it.  Gained 13 lbs with chocolate available through holidays and binging.  Has decided not to do GOLO now but restart the clinic plan.  PCP increased Wellbutrin to 300mg .  Weight loss specialist told her to stop shaming herself.  Has felt tough enough driving that brought her today.  COVID set off great fatigue, mostly headache and sinus/throat sxs.  Knows she has been deferring walking   Acknowledges hx ADHD and LD, plus shaming teachers who made her self-conscious.    Coached in just-for-today thinking about whatever plan she follows.  Affirmed re-engaging the weight clinic.    Therapeutic modalities: {AM:23362::"Cognitive Behavioral Therapy","Solution-Oriented/Positive Psychology"}  Mental Status/Observations:  Appearance:   {PSY:22683}     Behavior:  {PSY:21022743}  Motor:  {PSY:22302}  Speech/Language:   {PSY:22685}  Affect:  {PSY:22687}  Mood:  {PSY:31886}  Thought process:  {PSY:31888}  Thought content:    {PSY:[phone]}  Sensory/Perceptual disturbances:    {[phone]}  Orientation:  {Psych Orientation:23301::"Fully oriented"}  Attention:  {Good-Fair-Poor ratings:23770::"Good"}    Concentration:  {Good-Fair-Poor ratings:23770::"Good"}  Memory:  {PSY:[phone]}  Insight:    {Good-Fair-Poor ratings:23770::"Good"}  Judgment:   {Good-Fair-Poor ratings:23770::"Good"}  Impulse Control:  {Good-Fair-Poor ratings:23770::"Good"}   Risk Assessment: Danger to  Self: {Risk:22599::"No"} Self-injurious Behavior: {Risk:22599::"No"} Danger to Others: {Risk:22599::"No"} Physical Aggression / Violence: {Risk:22599::"No"} Duty to Warn: {AMYesNo:22526::"No"} Access to Firearms a concern: {AMYesNo:22526::"No"}  Assessment of progress:  {Progress:22147::"progressing"}  Diagnosis:   ICD-10-CM   1. Compulsive overeating  F50.89     2. Generalized anxiety disorder with panic attacks  F41.1    F41.0     3. Dysthymia  F34.1     4. Attention deficit hyperactivity disorder (ADHD), unspecified ADHD type -- by hx  F90.9      Plan:  Priority today: Today get 5-10 minutes outside Practice "just for today" perspective -- no big demands, do sobriety like the 12-step people do Practice making short work of shame -- e.g., you can have 1 minute to blame or shame, then stop and go do something that will actually improve the problem.  Remember -- the beating drives the eating. Work out a deal with Mardelle Matte, as needed, to hold or hide tempting foods  Generally, if facing a binge, do anything else for 15 minutes, especially if it's movement, before deciding whether to eat To break down the tempting nature of chocolate, consider mice and rats in the factory, and the fact that you really don't know whether they got into the chocolate, and you can't tell by looking... Practice noticing and jotting down your feelings 3 times a day -- you can say as much or as little as you want about what's on your mind, but name feeling.  Use the feeling words chart to pick a word you don't use so often. All of the above reprinted on AVS For boundaries and family pressures: Self-affirm OK to say her own yes or not to any time, interactions Watch own tendency to resent and release as able For food behavior and  weight loss: OK with GOLO program -- monitor for emotional disruption from hormonal intervention and report promptly Continue to practice mindful eating, moments at least of slowing down  to notice food sensations Daily journal of food behavior -- struggles and any partial victories, and especially  affirm any time did something other than snack and it felt OK.  If logging food, be fully honest, don't leave anything out. Develop "menu" of things can do instead of munch -- painting, Youtube, greenhouse, walk, crochet, even TV -- 15-30 minutes before making any decision to snack Take own food to church dinners Make a habit of eating non-carbs first Slow down and enjoy the yummies she does get For feeling draggy, hitting the wall -- 1 tbsp MCT oil + 15 min Consider higher calorie target, e.g., 1200, and work for stability at that before trying to go lower Omega 3 for antiinflammatory benefit  Continuing option specialty weight loss clinic [employer] for support and morale For self-esteem and mood: Self-edit calling herself stupid, etc Self-affirm having successfully gone to physical exams options for stress -- get hands busy, speak her feelings, vocalize/scream, clench hands 90 seconds, various crafts (crochet, greenhouse, others) Exercise as tolerated stretch back and legs for sciatic help Other recommendations/advice as may be noted above Continue to utilize previously learned skills ad lib Maintain medication as prescribed and work faithfully with relevant prescriber(s) if any changes are desired or seem indicated Call the clinic on-call service, 988/hotline, 911, or present to [facility] or ER if any life-threatening psychiatric crisis Return for session(s) already scheduled, recommend scheduling ahead. Already scheduled visit in this office 04/23/2021.  Robley Fries, PhD [name], PhD LP Clinical Psychologist, [facility] [address] [place], [place] [address] [phone]

## 2021-04-23 ENCOUNTER — Ambulatory Visit (INDEPENDENT_AMBULATORY_CARE_PROVIDER_SITE_OTHER): Payer: MEDICARE | Admitting: Psychiatry

## 2021-04-23 DIAGNOSIS — F411 Generalized anxiety disorder: Secondary | ICD-10-CM | POA: Diagnosis not present

## 2021-04-23 DIAGNOSIS — F909 Attention-deficit hyperactivity disorder, unspecified type: Secondary | ICD-10-CM | POA: Diagnosis not present

## 2021-04-23 DIAGNOSIS — F341 Dysthymic disorder: Secondary | ICD-10-CM

## 2021-04-23 DIAGNOSIS — M797 Fibromyalgia: Secondary | ICD-10-CM

## 2021-04-23 DIAGNOSIS — F5089 Other specified eating disorder: Secondary | ICD-10-CM | POA: Diagnosis not present

## 2021-04-23 DIAGNOSIS — F410 Panic disorder [episodic paroxysmal anxiety] without agoraphobia: Secondary | ICD-10-CM

## 2021-04-23 NOTE — Progress Notes (Unsigned)
Psychotherapy Progress Note [facility] [name], PhD LP  Patient ID: Amy Spence)    MRN: [id number] Therapy format: Individual psychotherapy Date: 04/23/2021      Start: 3:22p     Stop: ***:***     Time Spent: *** min Location: Telehealth visit -- I connected with this patient by an approved telecommunication method (video), with her informed consent, and verifying identity and patient privacy.  I was located at my office and patient at her home.  As needed, we discussed the limitations, risks, and security and privacy concerns associated with telehealth service, including the availability and conditions which currently govern in-person appointments and the possibility that 3rd-party payment may not be fully guaranteed and she may be responsible for charges.  After she indicated understanding, we proceeded with the session.  Also discussed treatment planning, as needed, including ongoing verbal agreement with the plan, the opportunity to ask and answer all questions, her demonstrated understanding of instructions, and her readiness to call the office should symptoms worsen or she feels she is in a crisis state and needs more immediate and tangible assistance.   Session narrative (presenting needs, interim history, self-report of stressors and symptoms, applications of prior therapy, status changes, and interventions made in session) Started back to weight loss clinic, but developed vertigo.  Ruled out CVA and tumor, now getting herself checked out for cardio issues.  Had sharp pain mid chest a couple weeks ago.  Describes something more like ataxia, only after going through COVID, then another severe sinus infection, then UTI.  By description, it might be vestibular problem.  Referred to neurologist, likely will see ENT next.  Found herself anxious and tearful in response to the initial attack.    Says she got very embarrassed with Mardelle Matte taking her to weight  management, and how he barked at her in front of the nutritionist to cut the appt short, when he had a business call telling him about a time change and they have to go.    Therapeutic modalities: {AM:23362::"Cognitive Behavioral Therapy","Solution-Oriented/Positive Psychology"}  Mental Status/Observations:  Appearance:   {PSY:22683}     Behavior:  {PSY:21022743}  Motor:  {PSY:22302}  Speech/Language:   {PSY:22685}  Affect:  {PSY:22687}  Mood:  {PSY:31886}  Thought process:  {PSY:31888}  Thought content:    {PSY:214-192-5188}  Sensory/Perceptual disturbances:    {[phone]}  Orientation:  {Psych Orientation:23301::"Fully oriented"}  Attention:  {Good-Fair-Poor ratings:23770::"Good"}    Concentration:  {Good-Fair-Poor ratings:23770::"Good"}  Memory:  {[phone]}  Insight:    {Good-Fair-Poor ratings:23770::"Good"}  Judgment:   {Good-Fair-Poor ratings:23770::"Good"}  Impulse Control:  {Good-Fair-Poor ratings:23770::"Good"}   Risk Assessment: Danger to Self: {Risk:22599::"No"} Self-injurious Behavior: {Risk:22599::"No"} Danger to Others: {Risk:22599::"No"} Physical Aggression / Violence: {Risk:22599::"No"} Duty to Warn: {AMYesNo:22526::"No"} Access to Firearms a concern: {AMYesNo:22526::"No"}  Assessment of progress:  {Progress:22147::"progressing"}  Diagnosis: No diagnosis found. Plan:  Try decongestant for lingering sinus issues and suspected inner ear fluid, seek physician advice and ENT at interest Consider asserting with Mardelle Matte about embarrassing her -- use 5-step asking for change process, practice imaginally as needed, imagine practicing in [place] office if helpful Other recommendations/advice as may be noted above Continue to utilize previously learned skills ad lib Maintain medication as prescribed and work faithfully with relevant prescriber(s) if any changes are desired or seem indicated Call the clinic on-call service, 988/hotline, 911, or present to [facility] or ER if any  life-threatening psychiatric crisis Return for session(s) already scheduled. Already scheduled visit in this office 05/21/2021.  Blanchie Serve, PhD Luan Moore, PhD LP Clinical Psychologist, [facility] [address], [place], [place] [address] [phone]

## 2021-04-26 ENCOUNTER — Encounter: Payer: Self-pay | Admitting: Cardiology

## 2021-04-26 ENCOUNTER — Other Ambulatory Visit: Payer: Self-pay

## 2021-04-26 ENCOUNTER — Ambulatory Visit: Payer: MEDICARE | Admitting: Cardiology

## 2021-04-26 VITALS — BP 128/80 | HR 85 | Temp 97.8°F | Resp 16 | Ht 63.0 in | Wt 199.0 lb

## 2021-04-26 DIAGNOSIS — E6609 Other obesity due to excess calories: Secondary | ICD-10-CM

## 2021-04-26 DIAGNOSIS — K5904 Chronic idiopathic constipation: Secondary | ICD-10-CM

## 2021-04-26 DIAGNOSIS — R079 Chest pain, unspecified: Secondary | ICD-10-CM

## 2021-04-26 DIAGNOSIS — K219 Gastro-esophageal reflux disease without esophagitis: Secondary | ICD-10-CM

## 2021-04-26 MED ORDER — OMEPRAZOLE 20 MG PO CPDR: 20.0000 mg | DELAYED_RELEASE_CAPSULE | Freq: Every day | ORAL | 2 refills | Status: DC

## 2021-04-26 NOTE — Progress Notes (Signed)
Primary Physician/Referring:  Algis Greenhouse, MD  Patient ID: Amy Spence, female    DOB: [birth year], 66 y.o.   MRN: [id number]  Chief Complaint  Patient presents with   Chest Heaviness   New Patient (Initial Visit)   HPI:    Amy Spence  is a 66 y.o. Caucasian female patient with no significant prior cardiovascular history, had COVID in December 2022 and since then has noticed slight imbalance in the body, MRI of the brain was negative.  She made an appointment to see me for chest pain.  Patient's symptoms are described as tightness in the chest, mostly occurs when she is laying down in bed and occasionally has happen when she is walking.  She does take walks with her husband and has never complained of chest tightness associated with exertion.  She does admit to being sedentary.  She has GERD and has food regurgitation occasionally.  She has no history of hypertension, was placed on lipid-lowering therapy in the past but has discontinued this 6 months ago.  Past Medical History:  Diagnosis Date   Anemia    Anxiety    Arthritis    DDD (degenerative disc disease), lumbar    Depression    Erosive gastritis    Fibromyalgia    GERD (gastroesophageal reflux disease)    Pneumonia    Past Surgical History:  Procedure Laterality Date   BREAST SURGERY     augmentation   EYE SURGERY     eye lid   KNEE ARTHROSCOPY     REVERSE SHOULDER ARTHROPLASTY Right 12/03/2018   Procedure: REVERSE SHOULDER ARTHROPLASTY;  Surgeon: Justice Britain, MD;  Location: [facility];  Service: Orthopedics;  Laterality: Right;  122min   TONSILLECTOMY     TOTAL KNEE ARTHROPLASTY Right 05/16/2020   Procedure: TOTAL KNEE ARTHROPLASTY;  Surgeon: Paralee Cancel, MD;  Location: [facility];  Service: Orthopedics;  Laterality: Right;  70 mins   TUBAL LIGATION     Family History  Problem Relation Age of Onset   Arthritis Mother    Dementia Mother    Dementia Father    Arthritis Sister    Arthritis Sister     Arthritis Brother    Breast cancer Paternal Aunt    Heart attack Maternal Grandfather    Irritable bowel syndrome Daughter     Social History   Tobacco Use   Smoking status: Former    Packs/day: 1.00    Years: 3.00    Pack years: 3.00    Types: Cigarettes    Quit date: 1978    Years since quitting: 45.1   Smokeless tobacco: Never   Tobacco comments:    as a teen  Substance Use Topics   Alcohol use: No   Marital Status: Married  ROS  Review of Systems  Cardiovascular:  Positive for chest pain. Negative for dyspnea on exertion and leg swelling.  Objective  Blood pressure 128/80, pulse 85, temperature 97.8 F (36.6 C), temperature source Temporal, resp. rate 16, height 5\' 3"  (1.6 m), weight 199 lb (90.3 kg), SpO2 98 %. Body mass index is 35.25 kg/m.  Vitals with BMI 04/26/2021 04/26/2021 05/17/2020  Height - 5\' 3"  -  Weight - 199 lbs -  BMI - [id number] -  Systolic 0000000 [id number]  Diastolic 80 83 73  Pulse - 85 73     Physical Exam Constitutional:      Appearance: She is obese.  Neck:     Vascular: No carotid  bruit or JVD.  Cardiovascular:     Rate and Rhythm: Normal rate and regular rhythm.     Pulses: Intact distal pulses.     Heart sounds: Normal heart sounds. No murmur heard.   No gallop.  Pulmonary:     Effort: Pulmonary effort is normal.     Breath sounds: Normal breath sounds.  Abdominal:     General: Bowel sounds are normal.     Palpations: Abdomen is soft.  Musculoskeletal:     Right lower leg: No edema.     Left lower leg: No edema.     Medications and allergies   Allergies  Allergen Reactions   Latex Rash    Minor irriation     Medication list after today's encounter   Outpatient medication prior to visit Current Meds  Medication Sig   Artificial Tear Solution (SOOTHE XP) SOLN Place 1 drop into both eyes 2 (two) times daily as needed (dry eyes).   atorvastatin (LIPITOR) 20 MG tablet Take 20 mg by mouth daily.   buPROPion (WELLBUTRIN XL) 150 MG 24  hr tablet Take 450 mg by mouth daily.   Cetirizine HCl (ZYRTEC ALLERGY) 10 MG CAPS Take 10 mg by mouth daily.   Cholecalciferol (VITAMIN D) 50 MCG (2000 UT) tablet Take 2,000 Units by mouth daily.   Cyanocobalamin (B-12) 5000 MCG SUBL Place 5,000 mcg under the tongue daily.   docusate sodium (COLACE) 100 MG capsule Take 100 mg by mouth every 8 (eight) hours as needed for mild constipation.   finasteride (PROSCAR) 5 MG tablet Take 2.5 mg by mouth daily. Hair Loss   fluticasone (FLONASE) 50 MCG/ACT nasal spray Place 1 spray into both nostrils daily as needed for allergies.   gabapentin (NEURONTIN) 400 MG capsule Take 400 mg by mouth 3 (three) times daily.   glycerin adult 2 g suppository Place 1 suppository rectally daily as needed for constipation.   hydrOXYzine (ATARAX/VISTARIL) 10 MG tablet Take 10 mg by mouth 3 (three) times daily as needed for anxiety.   meloxicam (MOBIC) 15 MG tablet Take 15 mg by mouth daily.   naltrexone (DEPADE) 50 MG tablet Take by mouth daily.   omeprazole (PRILOSEC) 20 MG capsule Take 1 capsule (20 mg total) by mouth daily before breakfast.   Prenatal Vit-Fe Fumarate-FA (PRENATAL MULTIVITAMIN) TABS tablet Take 1 tablet by mouth daily at 12 noon.   sertraline (ZOLOFT) 100 MG tablet Take 200 mg by mouth daily.   spironolactone (ALDACTONE) 50 MG tablet Take 50 mg by mouth daily.   Vibegron (GEMTESA) 75 MG TABS Take 1 tablet by mouth daily.   zinc gluconate 50 MG tablet Take 50 mg by mouth daily.      Current Outpatient Medications:    Artificial Tear Solution (SOOTHE XP) SOLN, Place 1 drop into both eyes 2 (two) times daily as needed (dry eyes)., Disp: , Rfl:    atorvastatin (LIPITOR) 20 MG tablet, Take 20 mg by mouth daily., Disp: , Rfl:    buPROPion (WELLBUTRIN XL) 150 MG 24 hr tablet, Take 450 mg by mouth daily., Disp: , Rfl:    Cetirizine HCl (ZYRTEC ALLERGY) 10 MG CAPS, Take 10 mg by mouth daily., Disp: , Rfl:    Cholecalciferol (VITAMIN D) 50 MCG (2000 UT)  tablet, Take 2,000 Units by mouth daily., Disp: , Rfl:    Cyanocobalamin (B-12) 5000 MCG SUBL, Place 5,000 mcg under the tongue daily., Disp: , Rfl:    docusate sodium (COLACE) 100 MG capsule, Take 100  mg by mouth every 8 (eight) hours as needed for mild constipation., Disp: , Rfl:    finasteride (PROSCAR) 5 MG tablet, Take 2.5 mg by mouth daily. Hair Loss, Disp: , Rfl:    fluticasone (FLONASE) 50 MCG/ACT nasal spray, Place 1 spray into both nostrils daily as needed for allergies., Disp: , Rfl:    gabapentin (NEURONTIN) 400 MG capsule, Take 400 mg by mouth 3 (three) times daily., Disp: , Rfl:    glycerin adult 2 g suppository, Place 1 suppository rectally daily as needed for constipation., Disp: , Rfl:    hydrOXYzine (ATARAX/VISTARIL) 10 MG tablet, Take 10 mg by mouth 3 (three) times daily as needed for anxiety., Disp: , Rfl:    meloxicam (MOBIC) 15 MG tablet, Take 15 mg by mouth daily., Disp: , Rfl:    naltrexone (DEPADE) 50 MG tablet, Take by mouth daily., Disp: , Rfl:    omeprazole (PRILOSEC) 20 MG capsule, Take 1 capsule (20 mg total) by mouth daily before breakfast., Disp: 30 capsule, Rfl: 2   Prenatal Vit-Fe Fumarate-FA (PRENATAL MULTIVITAMIN) TABS tablet, Take 1 tablet by mouth daily at 12 noon., Disp: , Rfl:    sertraline (ZOLOFT) 100 MG tablet, Take 200 mg by mouth daily., Disp: , Rfl:    spironolactone (ALDACTONE) 50 MG tablet, Take 50 mg by mouth daily., Disp: , Rfl:    Vibegron (GEMTESA) 75 MG TABS, Take 1 tablet by mouth daily., Disp: , Rfl:    zinc gluconate 50 MG tablet, Take 50 mg by mouth daily., Disp: , Rfl:   Laboratory examination:   Recent Labs    05/17/20 0316  NA 137  K 4.4  CL 105  CO2 24  GLUCOSE 109*  BUN 15  CREATININE 0.71  CALCIUM 8.7*  GFRNONAA >60   CrCl cannot be calculated (Patient's most recent lab result is older than the maximum 21 days allowed.).  CMP Latest Ref Rng & Units 05/17/2020 02/21/2011  Glucose 70 - 99 mg/dL 109(H) 91  BUN 8 - 23 mg/dL  15 20  Creatinine 0.44 - 1.00 mg/dL 0.71 0.8  Sodium 135 - 145 mmol/L 137 141  Potassium 3.5 - 5.1 mmol/L 4.4 4.3  Chloride 98 - 111 mmol/L 105 103  CO2 22 - 32 mmol/L 24 31  Calcium 8.9 - 10.3 mg/dL 8.7(L) 9.5  Total Protein 6.0 - 8.3 g/dL - 7.0  Total Bilirubin 0.3 - 1.2 mg/dL - 0.4  Alkaline Phos 39 - 117 U/L - 79  AST 0 - 37 U/L - 17  ALT 0 - 35 U/L - 17   CBC Latest Ref Rng & Units 05/17/2020 05/16/2020 04/13/2020  WBC 4.0 - 10.5 K/uL 12.0(H) 6.3 6.4  Hemoglobin 12.0 - 15.0 g/dL 11.0(L) 13.4 14.2  Hematocrit 36.0 - 46.0 % 33.4(L) 40.4 41.6  Platelets 150 - 400 K/uL 243 236 300    External labs:   Reviewed in Care everywhere  Radiology:     Cardiac Studies:   NA  EKG:   EKG 04/26/2021: Normal sinus rhythm at rate of 79 bpm, no evidence of ischemia, normal EKG.    Assessment     ICD-10-CM   1. Chest pain due to GERD  K21.9 EKG 12-Lead   R07.9 omeprazole (PRILOSEC) 20 MG capsule    2. Chronic idiopathic constipation  K59.04     3. Class 2 obesity due to excess calories without serious comorbidity with body mass index (BMI) of 35.0 to 35.9 in adult  E66.09  Z68.35       Orders Placed This Encounter  Procedures   EKG 12-Lead   Recommendations:   Amy Spence is a 66 y.o. Caucasian female patient with no significant prior cardiovascular history, had COVID in December 2022 and since then has noticed slight imbalance in the body, MRI of the brain was negative.  She made an appointment to see me for chest pain.  On further questioning, 1 episode of sharp chest pain is musculoskeletal with no recurrence in the epigastrium.  She has been having occasional episodes of chest tightness especially at night and also has noticed significant amount of food regurgitating.  Suspect in view of moderate obesity she has significant GERD, previously she had taken PPI.  I prescribed her omeprazole.  Obesity was discussed at length.  She is now enrolled at [facility]  weight loss management, encouraged her to be persistent with this.  She also has chronic constipation, advised her to try Metamucil 3 times a day 15 to 20 minutes prior to her meals which would also help with weight loss.  Her blood pressure slightly elevated upon presentation but was completely normal upon rechecking.  As the chest pain symptoms appear to be GERD related, unless she has persistent chest discomfort, I will see her back on a as needed basis.  I see her husband on a regular basis, he has known coronary disease, he is present at the bedside and verbalized understanding and to contact me if she has exertional chest pain.  I reviewed external labs, lipids are normal, she was previously on a statin but has discontinued this 6 months ago and lipids even prior to starting statins were fairly within normal limits.  There is no family history of rheumatoid artery disease.    Adrian Prows, MD, [facility] 04/26/2021, 11:55 AM Office: [phone]

## 2021-05-02 ENCOUNTER — Encounter: Payer: Self-pay | Admitting: Cardiology

## 2021-05-21 ENCOUNTER — Ambulatory Visit: Payer: MEDICARE | Admitting: Psychiatry

## 2021-05-23 ENCOUNTER — Other Ambulatory Visit: Payer: Self-pay

## 2021-05-23 ENCOUNTER — Ambulatory Visit (INDEPENDENT_AMBULATORY_CARE_PROVIDER_SITE_OTHER): Payer: MEDICARE | Admitting: Psychiatry

## 2021-05-23 DIAGNOSIS — F410 Panic disorder [episodic paroxysmal anxiety] without agoraphobia: Secondary | ICD-10-CM

## 2021-05-23 DIAGNOSIS — F341 Dysthymic disorder: Secondary | ICD-10-CM | POA: Diagnosis not present

## 2021-05-23 DIAGNOSIS — F411 Generalized anxiety disorder: Secondary | ICD-10-CM | POA: Diagnosis not present

## 2021-05-23 DIAGNOSIS — F909 Attention-deficit hyperactivity disorder, unspecified type: Secondary | ICD-10-CM | POA: Diagnosis not present

## 2021-05-23 DIAGNOSIS — Z638 Other specified problems related to primary support group: Secondary | ICD-10-CM | POA: Diagnosis not present

## 2021-05-23 DIAGNOSIS — F5089 Other specified eating disorder: Secondary | ICD-10-CM
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
## 2021-06-18 ENCOUNTER — Ambulatory Visit: Payer: MEDICARE | Admitting: Psychiatry

## 2021-07-13 ENCOUNTER — Other Ambulatory Visit: Payer: Self-pay | Admitting: Cardiology

## 2021-07-13 DIAGNOSIS — K219 Gastro-esophageal reflux disease without esophagitis: Secondary | ICD-10-CM

## 2021-07-16 ENCOUNTER — Ambulatory Visit (INDEPENDENT_AMBULATORY_CARE_PROVIDER_SITE_OTHER): Payer: MEDICARE | Admitting: Psychiatry

## 2021-07-16 DIAGNOSIS — G479 Sleep disorder, unspecified: Secondary | ICD-10-CM

## 2021-07-16 DIAGNOSIS — M797 Fibromyalgia: Secondary | ICD-10-CM | POA: Diagnosis not present

## 2021-07-16 DIAGNOSIS — F5089 Other specified eating disorder: Secondary | ICD-10-CM

## 2021-07-16 DIAGNOSIS — F410 Panic disorder [episodic paroxysmal anxiety] without agoraphobia: Secondary | ICD-10-CM

## 2021-07-16 DIAGNOSIS — F909 Attention-deficit hyperactivity disorder, unspecified type: Secondary | ICD-10-CM | POA: Diagnosis not present

## 2021-07-16 DIAGNOSIS — F411 Generalized anxiety disorder: Secondary | ICD-10-CM | POA: Diagnosis not present

## 2021-07-16 NOTE — Progress Notes (Signed)
Psychotherapy Progress Note [facility] Luan Moore, PhD LP  Patient ID: Amy Spence)    MRN: [id number] Therapy format: Individual psychotherapy Date: 07/16/2021      Start: 3:10p     Stop: 3:55p     Time Spent: 45 min Location: In-person   Session narrative (presenting needs, interim history, self-report of stressors and symptoms, applications of prior therapy, status changes, and interventions made in session) Nearly 2 months since last seen.  Weight management coaching call notes available in EHR recently, with emphasis on sleep hygiene.  A month ago, was dealing with balance/dizziness problems attributed to COVID earlier this year, though MRI in February showed a small right frontal hyperintensity, and MOCA score 20/30 on 06/18/21.  Seems to be recovered to baseline cognitively at this point.  Dismayed to be gaining weight, but started Wegovy injections, optimistic.  Nervous about tonight, with revival, and pastor from [place] coming in, embarrassed about her weight.  Encouraged to bear with it, keep focus on enjoying the reasons for the occasion and trusting that her appearance is not the focus of anyone's thoughts but her own.  PCP put her on 2x5mg  olanzapine, for sleep, in addition to 2x100mg  sertraline and 2x150mg  bupropion, apparently after checking on Lybalvi and founding out it's too expensive.  Confirms she has been educated in sleep hygiene, has been trying to separate electronic light from bedtime.  Has gotten herself back out for gardening and visiting others since getting better sleep.  Notices a 2-3 hr delay for olanzapine to hit.  Says her PCP has increased antidepressants based on PHQ scores.  Advised caution about doses of combination antidepressants and atypical antipsychotic.  Hopefully can back down to 150mg  Wellbutrin, given involuntary drawing sensations, and hopefully can back down to 7.5 or 5 mg Zyprexa given risk to blood sugar and  appetite.    Figures her anxiety/depression episode started with seeing mother in more deteriorated condition while visiting.  Support/empathy provided.   Therapeutic modalities: Cognitive Behavioral Therapy, Solution-Oriented/Positive Psychology, and Psycho-education/Bibliotherapy  Mental Status/Observations:  Appearance:   Casual     Behavior:  Appropriate  Motor:  Normal  Speech/Language:   Clear and Coherent  Affect:  Appropriate  Mood:  anxious and dysthymic  Thought process:  normal and mild flight  Thought content:    WNL  Sensory/Perceptual disturbances:    WNL  Orientation:  Fully oriented  Attention:  Good    Concentration:  Fair  Memory:  WNL  Insight:    Fair  Judgment:   Good  Impulse Control:  Fair   Risk Assessment: Danger to Self: No Self-injurious Behavior: No Danger to Others: No Physical Aggression / Violence: No Duty to Warn: No Access to Firearms a concern: No  Assessment of progress:  progressing  Diagnosis:   ICD-10-CM   1. Generalized anxiety disorder with panic attacks  F41.1    F41.0     2. Attention deficit hyperactivity disorder (ADHD), unspecified ADHD type -- by hx  F90.9     3. Compulsive overeating  F50.89     4. Fibromyalgia  M79.7     5. Difficulty sleeping  G47.9      Plan:  Today Back up the dose time for olanzapine, c. 8pm for 10pm bedtime Work with PCP on complexity and dosing of psychotropic regimen, or refer to psychiatry here if desired Apply sleep hygiene principles Ongoing For boundaries and family pressures: Self-affirm OK to say her own yes or not  to any time, interactions Watch own tendency to resent and release as able Self-affirm past actions caring for others and extracting from manipulation Open up as able to seeing family in [place] For food behavior and weight loss: OK with [facility] or weight clinic or another program of choice.  If hormonal, monitor for mood instability. Continue to practice mindful eating,  moments at least of slowing down to notice food sensations Work out a deal with Jonni Sanger, as needed, to hold or hide tempting foods  Generally, if facing a binge, do anything else for 15 minutes, especially if it's movement, before deciding whether to eat To break down the tempting nature of chocolate, consider mice and rats in the factory, and the fact that you really don't know whether they got into the chocolate, and you can't tell by looking... Practice noticing and jotting down your feelings 3 times a day -- you can say as much or as little as you want about what's on your mind, but name feeling.  Use the feeling words chart to pick a word you don't use so often. Daily journal of food behavior -- struggles and any partial victories, and especially affirm any time did something other than snack and it felt OK.  If logging food, be fully honest, don't leave anything out. Develop "menu" of things can do instead of munch -- painting, Youtube, greenhouse, walk, crochet, even TV -- 15-30 minutes before making any decision to snack Take own food to church dinners Make a habit of eating non-carbs first Slow down and enjoy the yummies she does get at leisure -- no woofing For feeling draggy, hitting the wall -- 1 tbsp MCT oil + 15 min Consider higher calorie target, e.g., 1200, and work for stability at that before trying to go lower Practice "just for today" perspective -- no big demands, do food sobriety like the 12-step people do [employer] Anonymous for support and morale For self-esteem and mood: Practice making short work of shame -- e.g., you can have 1 minute to blame or shame, then stop and go do something that will actually improve the problem.  Remember -- the beating drives the eating. Self-affirm when taking care health or nutrition business, including doctor visits Stress relief and general brain health Situational releases -- get hands busy, speak her feelings, vocalize/scream, clench  hands 90 seconds, various crafts (crochet, greenhouse, others) Exercise as tolerated.  Outdoor breaks, 5-10 minutes if not full walking Stretch back and legs for sciatic help Omega 3 for antiinflammatory benefit  Other recommendations/advice as may be noted above Continue to utilize previously learned skills ad lib Maintain medication as prescribed and work faithfully with relevant prescriber(s) if any changes are desired or seem indicated Call the clinic on-call service, 988/hotline, 911, or present to [facility] or ER if any life-threatening psychiatric crisis Return for session(s) already scheduled. Already scheduled visit in this office 08/13/2021.  Blanchie Serve, PhD Luan Moore, PhD LP Clinical Psychologist, [facility] [address] [place], [place] [address] [phone]

## 2021-08-10 ENCOUNTER — Other Ambulatory Visit: Payer: Self-pay | Admitting: Cardiology

## 2021-08-10 DIAGNOSIS — R079 Chest pain, unspecified: Secondary | ICD-10-CM

## 2021-08-13 ENCOUNTER — Ambulatory Visit (INDEPENDENT_AMBULATORY_CARE_PROVIDER_SITE_OTHER): Payer: MEDICARE | Admitting: Psychiatry

## 2021-08-13 DIAGNOSIS — F341 Dysthymic disorder: Secondary | ICD-10-CM

## 2021-08-13 DIAGNOSIS — F411 Generalized anxiety disorder: Secondary | ICD-10-CM | POA: Diagnosis not present

## 2021-08-13 DIAGNOSIS — Z658 Other specified problems related to psychosocial circumstances: Secondary | ICD-10-CM | POA: Diagnosis not present

## 2021-08-13 DIAGNOSIS — F410 Panic disorder [episodic paroxysmal anxiety] without agoraphobia: Secondary | ICD-10-CM

## 2021-08-13 DIAGNOSIS — F909 Attention-deficit hyperactivity disorder, unspecified type: Secondary | ICD-10-CM

## 2021-08-13 NOTE — Progress Notes (Signed)
Psychotherapy Progress Note [facility] Luan Moore, PhD LP  Patient ID: Amy Spence)    MRN: [id number] Therapy format: Individual psychotherapy Date: 08/13/2021      Start: 3:20p     Stop: 4:06p     Time Spent: 46 min Location: In-person   Session narrative (presenting needs, interim history, self-report of stressors and symptoms, applications of prior therapy, status changes, and interventions made in session) Biggest concern right now is church, namely her own social discomfort there.  H at home there, and she likes the people, but thought she was being gossiped about audibly by pastor and wife in a class she attended, and says she did get approached fairly sternly at a dinner when they neglected to bring a dish and H Jonni Sanger went out to fetch something fast.  H concerned she'll be discontent no matter where they go, which seems like conclusion jumping on his part but may match some of their history.  Knows she could be projecting herself, but plausible that the young pastor's wife Rubin Payor is also projecting her own judgment sometimes.  Reframed as worth clarification and perception checking, coached and how to do so, and resolved will do so if she does it again,.  Meantime will try to write it off as a young person's anxiety being in position of authority alongside her own insecurity.  Somewhat related issue attending a free will [employer], with which she knows there is a theological division over whether one can lose their salvation.  She has reassurance from a former pastor that that is not nocturnally sound, and she can safely count on her own, once and forever salvation, a viewpoint she shares with her husband and that her husband has expressed kindly as a difference of opinion with leadership of their current church.  Nevertheless, she notes nagging doubt for having been amongst people who believe otherwise people reframed as an effective being a cognitive  minority in a relatively new place and her own strong instinct to fit in, but reminded that if the shoe were on the other foot and she was counseling her daughter, her husband, or anyone else, the coverage of her convictions would be clearer.  Encouraged to see this as merely her anxiety disorder acting up in the context of others' doubts and differences, and safe to disregard and go on in faith.  Meanwhile, c/o H checking behind her and disbelieving things, e.g., the instructions she got from home sleep study that she could simply throw away measurement devices after she is done with the study.  Many other examples of how he may remind her to do something without her showing need, or otherwise instruct her.  Similar to the above, agreed she has some ADHD but has not made costly mistakes and her husband is probably over applying his railroad Transport planner.  Acknowledges his tendency is a 45-year struggle amping up her own insecurity.  Reframed behavior she sees as his "controlling" as a "game of Fear Hot Potato" and recommended using his unsolicited instructions as a cue to ask him what he is afraid will happen, reasoning that if he articulates his fears better, he will see them as unfounded or at least provide the opportunity for her to ask him if he really believes she would make the mistake he is afraid of.  Alternatively, offered the playful idea that she could require him to put on his railroad Chief Technology Officer before giving orders.  Strategized for brittle  or defensive reactions she might get, particularly responding to the idea that she is telling him how to behave by straightforwardly telling him she is asking for his help, because the anxiety and worry he does not want her to have is something he himself can help by refraining from overdirecting her.  Therapeutic modalities: Cognitive Behavioral Therapy, Solution-Oriented/Positive Psychology, Ego-Supportive, Faith-sensitive, and  Assertiveness/Communication  Mental Status/Observations:  Appearance:   Casual and Neat     Behavior:  Appropriate  Motor:  Normal  Speech/Language:   Clear and Coherent  Affect:  Appropriate  Mood:  anxious and dysthymic  Thought process:  normal  Thought content:    worry  Sensory/Perceptual disturbances:    WNL  Orientation:  Fully oriented  Attention:  Good    Concentration:  Good  Memory:  WNL  Insight:    Fair  Judgment:   Good  Impulse Control:  Good   Risk Assessment: Danger to Self: No Self-injurious Behavior: No Danger to Others: No Physical Aggression / Violence: No Duty to Warn: No Access to Firearms a concern: No  Assessment of progress:  progressing  Diagnosis:   ICD-10-CM   1. Generalized anxiety disorder with panic attacks  F41.1    F41.0     2. Dysthymia  F34.1     3. Attention deficit hyperactivity disorder (ADHD), unspecified ADHD type -- by hx  F90.9     4. Spiritual problem  Z65.8      Plan:  Today Imaginal practice doing assertive perception checking with young pastor's wife Try playful and inquisitive assertiveness techniques with husband's overdirection habit, and remember that, like most folks who seem to overdo control, it is probably rooted in anxiety of his own, and she can help both of them by drawing out what that is Ongoing For boundaries and family pressures: Self-affirm OK to say her own yes or not to any time, interactions Watch own tendency to resent and release as able For food behavior and weight loss: OK with GOLO or weight clinic or another program of choice.  If hormonal, monitor for mood instability. Continue to practice mindful eating, moments at least of slowing down to notice food sensations Work out a deal with Jonni Sanger, as needed, to hold or hide tempting foods  Generally, if facing a binge, do anything else for 15 minutes, especially if it's movement, before deciding whether to eat To break down the tempting nature of  chocolate, use imaginable disgust, e.g., figuring there are mice and rats in the factory, and the fact that you really can't know whether they got into the chocolate, and you can't tell by looking... Practice noticing and jotting down your feelings 3 times a day -- you can say as much or as little as you want about what's on your mind, but name feeling.  Use the feeling words chart to pick a word you don't use so often. Daily journal of food behavior -- struggles and any partial victories, and especially affirm any time did something other than snack and it felt OK.  If logging food, be fully honest, don't leave anything out. Develop "menu" of things can do instead of munch -- painting, Youtube, greenhouse, walk, crochet, even TV -- 15-30 minutes before making any decision to snack Take own food to church dinners Make a habit of eating non-carbs first Slow down and enjoy the yummies she does get For feeling draggy, hitting the wall -- 1 tbsp MCT oil + 15 min Consider higher calorie  target, e.g., 1200, and work for stability at that before trying to go lower Omega 3 for antiinflammatory benefit  Practice "just for today" perspective -- no big demands, do sobriety like the 12-step people do Contractor Anonymous for support and morale For self-esteem and mood: Practice making short work of shame -- e.g., you can have 1 minute to blame or shame, then stop and go do something that will actually improve the problem.  Remember -- the beating drives the eating. Self-affirm when taking care health or nutrition business, including doctor visits options for stress -- get hands busy, speak her feelings, vocalize/scream, clench hands 90 seconds, various crafts (crochet, greenhouse, others) Exercise as tolerated.  Outdoor breaks, 5-10 minutes if not full walking Stretch back and legs for sciatic help Other recommendations/advice as may be noted above Continue to utilize previously learned skills ad  lib Maintain medication as prescribed and work faithfully with relevant prescriber(s) if any changes are desired or seem indicated Call the clinic on-call service, 988/hotline, 911, or present to [facility] or ER if any life-threatening psychiatric crisis Return for session(s) already scheduled. Already scheduled visit in this office 09/24/2021.  Blanchie Serve, PhD Luan Moore, PhD LP Clinical Psychologist, [facility] [address] [place], [place] [address] [phone]

## 2021-09-12 ENCOUNTER — Ambulatory Visit: Payer: Self-pay | Admitting: Allergy and Immunology

## 2021-09-24 ENCOUNTER — Ambulatory Visit: Payer: MEDICARE | Admitting: Psychiatry

## 2021-10-24 ENCOUNTER — Ambulatory Visit: Payer: MEDICARE | Admitting: Psychiatry

## 2021-11-20 ENCOUNTER — Ambulatory Visit (INDEPENDENT_AMBULATORY_CARE_PROVIDER_SITE_OTHER): Payer: MEDICARE | Admitting: Psychiatry

## 2021-11-20 DIAGNOSIS — F341 Dysthymic disorder: Secondary | ICD-10-CM

## 2021-11-20 DIAGNOSIS — F411 Generalized anxiety disorder: Secondary | ICD-10-CM | POA: Diagnosis not present

## 2021-11-20 DIAGNOSIS — E678 Other specified hyperalimentation: Secondary | ICD-10-CM | POA: Diagnosis not present

## 2021-11-20 DIAGNOSIS — Z638 Other specified problems related to primary support group: Secondary | ICD-10-CM

## 2021-11-20 DIAGNOSIS — F5089 Other specified eating disorder: Secondary | ICD-10-CM

## 2021-11-20 DIAGNOSIS — Z636 Dependent relative needing care at home: Secondary | ICD-10-CM

## 2021-11-20 DIAGNOSIS — F909 Attention-deficit hyperactivity disorder, unspecified type: Secondary | ICD-10-CM

## 2021-11-20 DIAGNOSIS — F410 Panic disorder [episodic paroxysmal anxiety] without agoraphobia: Secondary | ICD-10-CM

## 2021-11-20 NOTE — Progress Notes (Signed)
Psychotherapy Progress Note [facility] [name], PhD LP  Patient ID: Amy Spence)    MRN: [id number] Therapy format: Individual psychotherapy Date: 11/20/2021      Start: 3:19p     Stop: 4:05p     Time Spent: 46 min Location: In-person   Session narrative (presenting needs, interim history, self-report of stressors and symptoms, applications of prior therapy, status changes, and interventions made in session) Been 3 months since last seen.  Some time occupied with mother, in [place].  Went to visit 2 weeks, 5 hrs/D, while S Aurther Loft was out of the country.  M's memory and behavior changing in the nursing home.  Some warming in the relationship with Aurther Loft, even gave her a hug.    Been on Wegovy but can't tolerate the 2.4mg  dose and discontinued 2 weeks.  Lost weight rapidly, but has been in bed more, and lost muscle mass.  Does feel she is capable of using and reviving muscles.  OB/GYN wisely directed her to get more fat in the mix, not just stick with protein-heavy regimen.  Challenge now is to get moving, rebuild muscle, encouraged in doing so.  For one thing, sobering to see a same-age peer of hers, dealing with being widowed, having lost 2 kids, and diabetic enough to lose a leg.  Discussed options, resolved to use a stationary bike she already has and take walks, preferably with some incline involved.  Interested in stretching, encouraged, there too.  Discussed ways to find "exercise snacks", too, like taking steps, parking away, standing up and moving a bit when sedentary.  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  No further concerns about gossip.  Briefly discussed if she still has doubts, how she can approach.  Getting along well with husband, too, not afflicted with fear of his judgment or temper.  Can still be anxious, but not nearly so much.  Discussed playful tactics if he does relapse in overdirecting.  Greatest dread right now has to do with  gowning up for a colonoscopy and starting a new class at church.  Refreshed on her good reasons to go for the procedure, relationship to medical staff as people she has hired for a purpose, and trust that they have learned not to see plenty of things in their jobs, the rest is about what she herself is looking at and what judgments he herself is making, and actually she can control that when she notices.  Therapeutic modalities: Cognitive Behavioral Therapy, Solution-Oriented/Positive Psychology, Environmental manager, and Faith-sensitive  Mental Status/Observations:  Appearance:   Casual     Behavior:  Appropriate  Motor:  Normal  Speech/Language:   Clear and Coherent  Affect:  Appropriate  Mood:  Less anxious  Thought process:  normal, some tangents, not as much  Thought content:    WNL and worry, self-consciousness  Sensory/Perceptual disturbances:    WNL  Orientation:  Fully oriented  Attention:  Good    Concentration:  Good  Memory:  WNL  Insight:    Good  Judgment:   Good  Impulse Control:  Good   Risk Assessment: Danger to Self: No Self-injurious Behavior: No Danger to Others: No Physical Aggression / Violence: No Duty to Warn: No Access to Firearms a concern: No  Assessment of progress:  progressing  Diagnosis:   ICD-10-CM   1. Generalized anxiety disorder with panic attacks  F41.1    F41.0     2. Dysthymia  F34.1  3. Excessive carbohydrate intake  E67.8     4. Compulsive overeating  F50.89     5. Attention deficit hyperactivity disorder (ADHD), unspecified ADHD type -- by hx  F90.9     6. Relationship problem with family member (sister)  Z87.8     7. Caregiver stress  Z63.6      Plan:  Priority today Omega 3 supplement for reduced inflammation Concur with rebalancing fat vs. protein calories Imaginal practice doing assertive perception checking with young pastor's wife Try playful and inquisitive assertiveness techniques with husband's overdirection habit.   Remember, like most folks who seem to overdo control, it's probably rooted in anxiety of his own, and she can help both of them by asking after what it is he's "afraid" of or concerned will happen if he doesn't give directions Practice thinking of colonoscopy as her opportunity, with hired help, who have learned to not see things, and to address her own self-criticism with kindness, acceptance, and the same grace she already shows others Ongoing For boundaries and family pressures: Self-affirm OK to say her own yes or not to any time, interactions Watch own tendency to resent and release as able For food behavior and weight loss: OK with [facility] or weight clinic or another program of choice., just apply herself.  If hormonal, monitor for mood instability. Continue to practice mindful eating, moments at least of slowing down to notice food sensations Apply self to some minium standard of cooking for health Continue deal with Mardelle Matte to hold/hide tempting foods  Delay strategies for hunger, when it comes, water first, movement first, anything else first To break down the tempting nature of chocolate, use imaginable disgust, e.g., figuring there are mice and rats in the factory, and the fact that you really can't know whether they got into the chocolate, and you can't tell by looking... Practice noticing and jotting down your feelings 3 times a day -- you can say as much or as little as you want about what's on your mind, but name feeling.  Use the feeling words chart to pick a word you don't use so often. Develop "menu" of things can do instead of munch -- painting, Youtube, greenhouse, walk, crochet, even TV -- 15-30 minutes before making any decision to snack Still recommend daily journal of food behavior -- struggles and any partial victories, and especially affirm any time did something other than snack and it felt OK.  If logging food, be fully honest, don't leave anything out. Take own food to church  dinners Make a habit of eating non-carbs first Slow down and enjoy the yummies she does get For feeling draggy, hitting the wall -- 1 tbsp MCT oil + 15 min Consider higher calorie target, e.g., 1200, and work for stability at that before trying to go lower Omega 3 for antiinflammatory benefit  Practice "just for today" perspective -- no big demands, do sobriety like the 12-step people do Education officer, community Anonymous for support and morale For self-esteem and mood: Practice making short work of shame -- e.g., you can have 1 minute to blame or shame, then stop and go do something that will actually improve the problem.  Remember -- the beating drives the eating. Self-affirm when taking care health or nutrition business, including doctor visits options for stress -- get hands busy, speak her feelings, vocalize/scream, clench hands 90 seconds, various crafts (crochet, greenhouse, others) Exercise as tolerated.  Outdoor breaks, 5-10 minutes if not full walking Stretch back and legs  for sciatic help Other recommendations/advice as may be noted above Continue to utilize previously learned skills ad lib Maintain medication as prescribed and work faithfully with relevant prescriber(s) if any changes are desired or seem indicated Call the clinic on-call service, 988/hotline, 911, or present to [facility] or ER if any life-threatening psychiatric crisis Return for session(s) already scheduled. Already scheduled visit in this office 12/13/2021.  Robley Fries, PhD [name], PhD LP Clinical Psychologist, [facility] [address] [place], [place] [address] [phone]

## 2021-11-22 ENCOUNTER — Other Ambulatory Visit: Payer: Self-pay | Admitting: Obstetrics and Gynecology

## 2021-11-22 DIAGNOSIS — N644 Mastodynia: Secondary | ICD-10-CM

## 2021-12-10 ENCOUNTER — Other Ambulatory Visit: Payer: MEDICARE

## 2021-12-13 ENCOUNTER — Ambulatory Visit: Payer: MEDICARE | Admitting: Psychiatry

## 2021-12-13 ENCOUNTER — Ambulatory Visit (INDEPENDENT_AMBULATORY_CARE_PROVIDER_SITE_OTHER): Payer: MEDICARE | Admitting: Psychiatry

## 2021-12-13 DIAGNOSIS — F410 Panic disorder [episodic paroxysmal anxiety] without agoraphobia: Secondary | ICD-10-CM

## 2021-12-13 DIAGNOSIS — F909 Attention-deficit hyperactivity disorder, unspecified type: Secondary | ICD-10-CM | POA: Diagnosis not present

## 2021-12-13 DIAGNOSIS — F5089 Other specified eating disorder: Secondary | ICD-10-CM

## 2021-12-13 DIAGNOSIS — F411 Generalized anxiety disorder: Secondary | ICD-10-CM

## 2021-12-13 DIAGNOSIS — F341 Dysthymic disorder: Secondary | ICD-10-CM | POA: Diagnosis not present

## 2021-12-13 DIAGNOSIS — Z658 Other specified problems related to psychosocial circumstances: Secondary | ICD-10-CM

## 2021-12-13 DIAGNOSIS — G479 Sleep disorder, unspecified: Secondary | ICD-10-CM

## 2021-12-13 NOTE — Progress Notes (Signed)
Psychotherapy Progress Note [facility] [name], PhD LP  Patient ID: Amy Spence)    MRN: [id number] Therapy format: Individual psychotherapy Date: 12/13/2021      Start: 3:09p     Stop: 3:55p     Time Spent: 46 min Location: In-person   Session narrative (presenting needs, interim history, self-report of stressors and symptoms, applications of prior therapy, status changes, and interventions made in session) Experiencing some disappointments with the [employer] church they've been attending.  She and Mardelle Matte apparently missed a contentious Bible study where the retired Education officer, environmental insisted the young, current pastor sign onto the idea that it is categorically wrong to drink, and that Jesus actually turned water to "grape" (juice), not wine.  Both of them are feeling reluctant to stay at a church that can battle like this.  Feels bad for young pastor [name], who first came in to mentor with the pastor who died, ascended to the role in an emergency,   Came through shingles recently, too.  Thinks it might have been for coming off Wegovy.  Doubtful, but allowed to hypothesize.  Big bother, really, is now how she's let her body go, and feeling in a rut again with food, ashamed and avoidant, sometimes sleeping to noon, then waking easily.  Orthopedic pain is flaring up, much of it attrib to post-shingles, but also shoulder pain and popping lumbar spine.  Has dx'd postherpetic neuralgia, too.  Hard to tolerate 3 gabapentin.    Oriented to what sounds like myofascial pain and strategized self-care.  Also refreshed motivation to measure and plan food better.  Therapeutic modalities: Cognitive Behavioral Therapy, Solution-Oriented/Positive Psychology, Environmental manager, and Faith-sensitive  Mental Status/Observations:  Appearance:   Casual     Behavior:  Attention-Seeking  Motor:  Normal  Speech/Language:   Clear and Coherent  Affect:  Appropriate  Mood:  anxious   Thought process:  normal  Thought content:    Obsessions  Sensory/Perceptual disturbances:    WNL  Orientation:  Fully oriented  Attention:  Good    Concentration:  Fair  Memory:  WNL  Insight:    Fair  Judgment:   Good  Impulse Control:  Fair   Risk Assessment: Danger to Self: No Self-injurious Behavior: No Danger to Others: No Physical Aggression / Violence: No Duty to Warn: No Access to Firearms a concern: No  Assessment of progress:  stabilized  Diagnosis:   ICD-10-CM   1. Generalized anxiety disorder with panic attacks  F41.1    F41.0     2. Dysthymia  F34.1     3. Compulsive overeating  F50.89     4. Attention deficit hyperactivity disorder (ADHD), unspecified ADHD type -- by hx  F90.9     5. Spiritual problem  Z65.8     6. Difficulty sleeping  G47.9      Plan:  For spiritual adjustment Imaginal practice doing assertive perception checking with anyone needed Consider position together about staying and working with people vs. fleeing congregational politics for somewhere else For medical anxiety Practice thinking of colonoscopy as her opportunity, with hired help, who have learned to not see things, and to address her own self-criticism with kindness, acceptance, and the same grace she already shows others For boundaries and family pressures: Self-affirm OK to say her own yes or not to any time, interactions Watch own tendency to resent and release as able Try playful and inquisitive assertiveness techniques with husband's overdirection habit.  Remember, like  most folks who seem to overdo control, it's probably rooted in anxiety of his own, and she can help both of them by asking after what it is he's "afraid" of or concerned will happen if he doesn't give directions For food behavior and weight loss: OK with GOLO or weight clinic or another program of choice., just apply herself.  If hormonal, monitor for mood instability. Continue to practice mindful eating,  moments at least of slowing down to notice food sensations Apply self to some minium standard of cooking for health Continue deal with Jonni Sanger to hold/hide tempting foods  Delay strategies for hunger, when it comes, water first, movement first, anything else first To break down the tempting nature of chocolate, use imaginable disgust, e.g., figuring there are mice and rats in the factory, and the fact that you really can't know whether they got into the chocolate, and you can't tell by looking... Practice noticing and jotting down your feelings 3 times a day -- you can say as much or as little as you want about what's on your mind, but name feeling.  Use the feeling words chart to pick a word you don't use so often. Develop "menu" of things can do instead of munch -- painting, Youtube, greenhouse, walk, crochet, even TV -- 15-30 minutes before making any decision to snack Still recommend daily journal of food behavior -- struggles and any partial victories, and especially affirm any time did something other than snack and it felt OK.  If logging food, be fully honest, don't leave anything out. Take own food to church dinners Make a habit of eating non-carbs first Slow down and enjoy the yummies she does get For feeling draggy, hitting the wall -- 1 tbsp MCT oil + 15 min Consider higher calorie target, e.g., 1200, and work for stability at that before trying to go lower Concur with rebalancing fat vs. protein calories Omega 3 for antiinflammatory benefit  Practice "just for today" perspective -- no big demands, do sobriety like the 12-step people do Contractor Anonymous for support and morale For self-esteem and mood: Practice making short work of shame -- e.g., you can have 1 minute to blame or shame, then stop and go do something that will actually improve the problem.  Remember -- the beating drives the eating. Self-affirm when taking care health or nutrition business, including doctor  visits options for stress -- get hands busy, speak her feelings, vocalize/scream, clench hands 90 seconds, various crafts (crochet, greenhouse, others) Exercise as tolerated.  Outdoor breaks, 5-10 minutes if not full walking Pain management Stretch back and legs for sciatic help May try Epsom salt baths for muscle tension relief Apply relaxation methods despite pain Other recommendations/advice as may be noted above Continue to utilize previously learned skills ad lib Maintain medication as prescribed and work faithfully with relevant prescriber(s) if any changes are desired or seem indicated Call the clinic on-call service, 988/hotline, 911, or present to [facility] or ER if any life-threatening psychiatric crisis Return in about 1 month (around 01/13/2022) for recommend scheduling ahead. Already scheduled visit in this office 01/14/2022.  Blanchie Serve, PhD Luan Moore, PhD LP Clinical Psychologist, [facility] [address], [place], [place] [address] [phone]

## 2021-12-25 ENCOUNTER — Other Ambulatory Visit: Payer: MEDICARE

## 2021-12-27 ENCOUNTER — Other Ambulatory Visit: Payer: Self-pay | Admitting: Cardiology

## 2021-12-27 DIAGNOSIS — K219 Gastro-esophageal reflux disease without esophagitis: Secondary | ICD-10-CM

## 2022-01-14 ENCOUNTER — Ambulatory Visit (INDEPENDENT_AMBULATORY_CARE_PROVIDER_SITE_OTHER): Payer: MEDICARE | Admitting: Psychiatry

## 2022-01-14 DIAGNOSIS — F411 Generalized anxiety disorder: Secondary | ICD-10-CM | POA: Diagnosis not present

## 2022-01-14 DIAGNOSIS — F5089 Other specified eating disorder: Secondary | ICD-10-CM | POA: Diagnosis not present

## 2022-01-14 DIAGNOSIS — E678 Other specified hyperalimentation: Secondary | ICD-10-CM

## 2022-01-14 DIAGNOSIS — F341 Dysthymic disorder: Secondary | ICD-10-CM | POA: Diagnosis not present

## 2022-01-14 DIAGNOSIS — F909 Attention-deficit hyperactivity disorder, unspecified type: Secondary | ICD-10-CM

## 2022-01-14 DIAGNOSIS — F410 Panic disorder [episodic paroxysmal anxiety] without agoraphobia: Secondary | ICD-10-CM

## 2022-01-14 NOTE — Progress Notes (Signed)
Psychotherapy Progress Note [facility] [name], PhD LP  Patient ID: Amy Spence)    MRN: [id number] Therapy format: Individual psychotherapy Date: 01/14/2022      Start: 2:15p     Stop: 3:00p     Time Spent: 45 min Location: In-person   Session narrative (presenting needs, interim history, self-report of stressors and symptoms, applications of prior therapy, status changes, and interventions made in session) News about the church -- attended a meeting to examine the pastor's fitness to continue serving (based on the theological flap that came up), disappointed in some individuals who voted for the young pastor to stay (8-2) then 3 declared soon after that they would be leaving the church.  Still appalled by the retired Education officer, environmental who staged the big confrontation.  Considering change of churches but not rushing it.  Validated the disappointment of it, and freedom to choose.  Witnessed grandson Amy Spence getting bullied at school (sees him as a Company secretary" for it), while she was waiting to pick him up.  Found herself beeping the horn and getting animated in a way that got the boy's attention, almost went in to fetch the principal but thought better of making too much of a scene.  Validated, discussed possible responses, emphasizing confide it to her daughter.  Encouraged further in diet control and trying out measures to control food cravings.  Therapeutic modalities: Cognitive Behavioral Therapy, Solution-Oriented/Positive Psychology, Environmental manager, and Faith-sensitive  Mental Status/Observations:  Appearance:   Casual     Behavior:  Appropriate  Motor:  Normal  Speech/Language:   Clear and Coherent  Affect:  Appropriate  Mood:  anxious and concerned  Thought process:  normal  Thought content:    WNL  Sensory/Perceptual disturbances:    WNL  Orientation:  Fully oriented  Attention:  Good    Concentration:  Fair  Memory:  WNL  Insight:    Fair  Judgment:    Good  Impulse Control:  Fair   Risk Assessment: Danger to Self: No Self-injurious Behavior: No Danger to Others: No Physical Aggression / Violence: No Duty to Warn: No Access to Firearms a concern: No  Assessment of progress:  stabilized  Diagnosis:   ICD-10-CM   1. Generalized anxiety disorder with panic attacks  F41.1    F41.0     2. Dysthymia  F34.1     3. Compulsive overeating  F50.89     4. Attention deficit hyperactivity disorder (ADHD), unspecified ADHD type -- by hx  F90.9     5. Excessive carbohydrate intake  E67.8      Plan:  For spiritual adjustment Imaginal practice doing assertive perception checking with anyone needed Consider position together about staying and working with people vs. fleeing congregational politics for somewhere else For medical anxiety Practice thinking of colonoscopy as her opportunity, with hired help, who have learned to not see things, and to address her own self-criticism with kindness, acceptance, and the same grace she already shows others For boundaries and family pressures: Self-affirm OK to say her own yes or not to any time, interactions Watch own tendency to resent and release as able Try playful and inquisitive assertiveness techniques with husband's overdirection habit.  Remember, like most folks who seem to overdo control, it's probably rooted in anxiety of his own, and she can help both of them by asking after what it is he's "afraid" of or concerned will happen if he doesn't give directions For food behavior and weight loss: OK  with [facility] or weight clinic or another program of choice., just apply herself.  If hormonal, monitor for mood instability. Continue to practice mindful eating, moments at least of slowing down to notice food sensations Apply self to some minium standard of cooking for health Continue deal with Jonni Sanger to hold/hide tempting foods  Delay strategies for hunger, when it comes, water first, movement first,  anything else first To break down the tempting nature of chocolate, use imaginable disgust, e.g., figuring there are mice and rats in the factory, and the fact that you really can't know whether they got into the chocolate, and you can't tell by looking... Practice noticing and jotting down your feelings 3 times a day -- you can say as much or as little as you want about what's on your mind, but name feeling.  Use the feeling words chart to pick a word you don't use so often. Develop "menu" of things can do instead of munch -- painting, Youtube, greenhouse, walk, crochet, even TV -- 15-30 minutes before making any decision to snack Still recommend daily journal of food behavior -- struggles and any partial victories, and especially affirm any time did something other than snack and it felt OK.  If logging food, be fully honest, don't leave anything out. Take own food to church dinners Make a habit of eating non-carbs first Slow down and enjoy the yummies she does get For feeling draggy, hitting the wall -- 1 tbsp MCT oil + 15 min Consider higher calorie target, e.g., 1200, and work for stability at that before trying to go lower Concur with rebalancing fat vs. protein calories Omega 3 for antiinflammatory benefit  Practice "just for today" perspective -- no big demands, do sobriety like the 12-step people do [employer] Anonymous for support and morale For self-esteem and mood: Practice making short work of shame -- e.g., you can have 1 minute to blame or shame, then stop and go do something that will actually improve the problem.  Remember -- the beating drives the eating. Self-affirm when taking care health or nutrition business, including doctor visits options for stress -- get hands busy, speak her feelings, vocalize/scream, clench hands 90 seconds, various crafts (crochet, greenhouse, others) Exercise as tolerated.  Outdoor breaks, 5-10 minutes if not full walking Pain  management Stretch back and legs for sciatic help May try Epsom salt baths for muscle tension relief Apply relaxation methods despite pain Other recommendations/advice as may be noted above Continue to utilize previously learned skills ad lib Maintain medication as prescribed and work faithfully with relevant prescriber(s) if any changes are desired or seem indicated Call the clinic on-call service, 988/hotline, 911, or present to [facility] or ER if any life-threatening psychiatric crisis Return for as already scheduled. Already scheduled visit in this office 02/12/2022.  Blanchie Serve, PhD Luan Moore, PhD LP Clinical Psychologist, [facility] [address], [place], [place] [address] [phone]

## 2022-02-12 ENCOUNTER — Ambulatory Visit (INDEPENDENT_AMBULATORY_CARE_PROVIDER_SITE_OTHER): Payer: MEDICARE | Admitting: Psychiatry

## 2022-02-12 DIAGNOSIS — F341 Dysthymic disorder: Secondary | ICD-10-CM | POA: Diagnosis not present

## 2022-02-12 DIAGNOSIS — M797 Fibromyalgia: Secondary | ICD-10-CM | POA: Diagnosis not present

## 2022-02-12 DIAGNOSIS — F410 Panic disorder [episodic paroxysmal anxiety] without agoraphobia: Secondary | ICD-10-CM

## 2022-02-12 DIAGNOSIS — F411 Generalized anxiety disorder: Secondary | ICD-10-CM

## 2022-02-12 DIAGNOSIS — F5089 Other specified eating disorder: Secondary | ICD-10-CM | POA: Diagnosis not present

## 2022-02-12 DIAGNOSIS — G479 Sleep disorder, unspecified: Secondary | ICD-10-CM

## 2022-02-12 NOTE — Progress Notes (Signed)
Psychotherapy Progress Note [facility] [name], PhD LP  Patient ID: Amy Spence)    MRN: [id number] Therapy format: Individual psychotherapy Date: 02/12/2022      Start: 2:08p     Stop: 2:56p     Time Spent: 48 min Location: In-person   Session narrative (presenting needs, interim history, self-report of stressors and symptoms, applications of prior therapy, status changes, and interventions made in session) "Defeated" by fibro pain of late, and daughter increasingly concerned, suggesting she has polymyalgia rheumatica.  Encouraged it is worth testing, possible referral to rheumatology.  Back is knotted up, not stretching or walking.    Discussed antiinflammatory strategy -- is on Mobic and gabapentin, educated gabapentin not effective for that.  Sleep is still fragmented after early waking.  Constipation is terrible, and insurance denying Rx for it (unknown).  Wellbutrin is at 450mg  + 100mg  Zoloft, which may be behind some mystery anxiety, recommend confer with PCP to reduce back to 300mg  Wellbutrin.    Worked on motivation to exercise and stretch.  Has walking available, and [employer] at home.  Discussed bedding -- maybe a too-thick mattress topper, maybe an adjustment to be made with pillow.  Otherwise, encourage massage, Epsom salt soaks, definitely stretching.  And overall, strategy of do a little anyway, without overdoing.  Looking forward to Christmas.  Church is going OK, relieved actually since a negative woman left and resolved she and H don't have to agree 100%.    Therapeutic modalities: Cognitive Behavioral Therapy, Solution-Oriented/Positive Psychology, Ego-Supportive, and Psycho-education/Bibliotherapy  Mental Status/Observations:  Appearance:   Casual     Behavior:  Appropriate  Motor:  Normal  Speech/Language:   Clear and Coherent  Affect:  Appropriate  Mood:  More even  Thought process:  normal  Thought content:    WNL   Sensory/Perceptual disturbances:    WNL  Orientation:  Fully oriented  Attention:  Good    Concentration:  Fair  Memory:  WNL  Insight:    Fair  Judgment:   Good  Impulse Control:  Good   Risk Assessment: Danger to Self: No Self-injurious Behavior: No Danger to Others: No Physical Aggression / Violence: No Duty to Warn: No Access to Firearms a concern: No  Assessment of progress:  progressing  Diagnosis:   ICD-10-CM   1. Dysthymia  F34.1     2. Generalized anxiety disorder with panic attacks  F41.1    F41.0     3. Fibromyalgia  M79.7     4. Compulsive overeating  F50.89     5. Difficulty sleeping  G47.9      Plan:  Check with pharmacist about likely constipation causes, and remedies For spiritual adjustment Imaginal practice doing assertive perception checking with anyone needed Consider position together about staying and working with people vs. fleeing congregational politics for somewhere else.  Emphasize working it through and let others stay or migrate as they see fit. For medical anxiety Practice thinking of colonoscopy as her opportunity, with hired help, who have learned to not see things, and to address her own self-criticism with kindness, acceptance, and the same grace she already shows others For boundaries and family pressures: Self-affirm OK to say her own yes or not to any time, interactions Watch own tendency to resent and release as able Try playful and inquisitive assertiveness techniques with husband's overdirection habit.  Remember, like most folks who seem to overdo control, it's probably rooted in anxiety of his own, and  she can help both of them by asking after what it is he's "afraid" of or concerned will happen if he doesn't give directions For food behavior and weight loss: OK with [facility] or weight clinic or another program of choice., just apply herself.  If hormonal, monitor for mood instability. Continue to practice mindful eating, moments at  least of slowing down to notice food sensations Apply self to some minium standard of cooking for health Continue deal with Mardelle Matte to hold/hide tempting foods  Delay strategies for hunger, when it comes, water first, movement first, anything else first To break down the tempting nature of chocolate, use imaginable disgust, e.g., figuring there are mice and rats in the factory, and the fact that you really can't know whether they got into the chocolate, and you can't tell by looking... Practice noticing and jotting down your feelings 3 times a day -- you can say as much or as little as you want about what's on your mind, but name feeling.  Use the feeling words chart to pick a word you don't use so often. Develop "menu" of things can do instead of munch -- painting, Youtube, greenhouse, walk, crochet, even TV -- 15-30 minutes before making any decision to snack Still recommend daily journal of food behavior -- struggles and any partial victories, and especially affirm any time did something other than snack and it felt OK.  If logging food, be fully honest, don't leave anything out. Take own food to church dinners Make a habit of eating non-carbs first Slow down and enjoy the yummies she does get For feeling draggy, hitting the wall -- 1 tbsp MCT oil + 15 min Consider higher calorie target, e.g., 1200, and work for stability at that before trying to go lower Concur with rebalancing fat vs. protein calories Omega 3 for antiinflammatory benefit  Practice "just for today" perspective -- no big demands, do sobriety like the 12-step people do Education officer, community Anonymous for support and morale For self-esteem and mood: Practice making short work of shame -- e.g., you can have 1 minute to blame or shame, then stop and go do something that will actually improve the problem.  Remember -- the beating drives the eating. Self-affirm when taking care health or nutrition business, including doctor visits options  for stress -- get hands busy, speak her feelings, vocalize/scream, clench hands 90 seconds, various crafts (crochet, greenhouse, others) Exercise as tolerated.  Outdoor breaks, 5-10 minutes if not full walking Pain management Stretch back and legs for sciatic help Improve antiinflammatory strategies May try Epsom salt baths for muscle tension relief Apply relaxation methods despite pain OK to check with rheumatologist about diagnosis Probably need to update bedding Other recommendations/advice as may be noted above Continue to utilize previously learned skills ad lib Maintain medication as prescribed and work faithfully with relevant prescriber(s) if any changes are desired or seem indicated.  Pursue Wellbutrin reduction or reassessment as noted. Call the clinic on-call service, 988/hotline, 911, or present to [facility] or ER if any life-threatening psychiatric crisis Return for as already scheduled. Already scheduled visit in this office 03/15/2022.  Robley Fries, PhD [name], PhD LP Clinical Psychologist, [facility] [address] [place], [place] [address] [phone]

## 2022-03-15 ENCOUNTER — Ambulatory Visit (INDEPENDENT_AMBULATORY_CARE_PROVIDER_SITE_OTHER): Payer: MEDICARE | Admitting: Psychiatry

## 2022-03-15 DIAGNOSIS — F909 Attention-deficit hyperactivity disorder, unspecified type: Secondary | ICD-10-CM

## 2022-03-15 DIAGNOSIS — F411 Generalized anxiety disorder: Secondary | ICD-10-CM | POA: Diagnosis not present

## 2022-03-15 DIAGNOSIS — F341 Dysthymic disorder: Secondary | ICD-10-CM

## 2022-03-15 DIAGNOSIS — E678 Other specified hyperalimentation: Secondary | ICD-10-CM

## 2022-03-15 DIAGNOSIS — F5089 Other specified eating disorder: Secondary | ICD-10-CM | POA: Diagnosis not present

## 2022-03-15 DIAGNOSIS — F410 Panic disorder [episodic paroxysmal anxiety] without agoraphobia: Secondary | ICD-10-CM

## 2022-03-15 DIAGNOSIS — G479 Sleep disorder, unspecified: Secondary | ICD-10-CM

## 2022-03-15 DIAGNOSIS — M797 Fibromyalgia: Secondary | ICD-10-CM

## 2022-03-15 NOTE — Progress Notes (Signed)
Psychotherapy Progress Note [facility] Luan Moore, PhD LP  Patient ID: Amy Spence)    MRN: [id number] Therapy format: Individual psychotherapy Date: 03/15/2022      Start: 2:19p     Stop: 3:06p     Time Spent: 47 min Location: In-person   Session narrative (presenting needs, interim history, self-report of stressors and symptoms, applications of prior therapy, status changes, and interventions made in session) Extra stress lately, losing hair again.  She and Jonni Sanger went through respiratory illness, his diagnosed RSV.  On hydroxyzine, does nothing noticeable.  Routinely hard to sleep through the night but often catching up later.  Recent times more able to go back to sleep.  Not much caffeine.  Feels she's been losing muscle mass.  Will get a sick feeling and have to lie down.   Says it's mostly since last seen early December.  Does coincide with getting sick, and having to put off seeing mother, but not marital or family issues spiking anxiety.  Fibro sxs flaring, suggesting an inflammatory spike.  Is back on Wegovy 1-2 months.  On spironolactone, and finasteride, through dermatologist, which was helping hair loss.  Having a new wave of loathing her body, loose skin, etc.  Could walk but pain prevents, as well.    Support/empathy provided, and gently confronted agonizing about it all as intensifying her stress, and moved to things she can do.  Wants to see a rheumatologist -- made recommendation based on  community reputation.  Confronted resistance to walking, stretching, other active measures, reframed her anxiety as something like an allergy to "trying", maybe even an actual brain response to conflict, expectations, and perceived helplessness.  Advocated for a mindset not so much "trying" as just doing, less thinking/worrying and more self-permission to dive in and just do a bit of something she's been resisting, with permission to emote if needed.  Specific tactics  below.  Therapeutic modalities: Cognitive Behavioral Therapy, Solution-Oriented/Positive Psychology, and Motivational Interviewing  Mental Status/Observations:  Appearance:   Casual     Behavior:  Appropriate  Motor:  Normal  Speech/Language:   Clear and Coherent  Affect:  Appropriate  Mood:  dysthymic  Thought process:  concrete  Thought content:    Obsessions  Sensory/Perceptual disturbances:    WNL  Orientation:  Fully oriented  Attention:  Good    Concentration:  Fair  Memory:  WNL  Insight:    Fair  Judgment:   Good  Impulse Control:  Fair   Risk Assessment: Danger to Self: No Self-injurious Behavior: No Danger to Others: No Physical Aggression / Violence: No Duty to Warn: No Access to Firearms a concern: No  Assessment of progress:  situational setback(s)  Diagnosis:   ICD-10-CM   1. Dysthymia  F34.1     2. Generalized anxiety disorder with panic attacks  F41.1    F41.0     3. Fibromyalgia  M79.7     4. Compulsive overeating  F50.89     5. Difficulty sleeping  G47.9     6. Attention deficit hyperactivity disorder (ADHD), unspecified ADHD type -- by hx  F90.9     7. Excessive carbohydrate intake  E67.8      Plan:  Recommend today Walk anyway, when pain allows When it doesn't, stretch some Take up suggestion to go to a senior center chair yoga class Make more of a practice of Epsom salt soaks for muscle and pain soothing, absorption of magnesium, and available aromatherapy  Catch body shame/loathing and stop, don't rehearse it Self-refer to rheumatology (e.g., Jeananne Rama, MD) to assess/update fibromyalgia vs. polymyalgia rheumatica vs. something else improve intake of antioxidants and antiinflammatory nutrients -- omega 3, green veg in particular, possibly biotin and/or B complex as noted for hair For spiritual adjustment Imaginal practice doing assertive perception checking with anyone needed Consider position together about staying and working  with people vs. fleeing congregational politics for somewhere else.  Emphasize working it through and let others stay or migrate as they see fit. For medical anxiety Practice thinking of colonoscopy as her opportunity, with hired help, who have learned to not see things, and to address her own self-criticism with kindness, acceptance, and the same grace she already shows others For boundaries and family pressures: Self-affirm OK to say her own yes or not to any time, interactions Watch own tendency to resent and release as able Try playful and inquisitive assertiveness techniques with husband's overdirection habit.  Remember, like most folks who seem to overdo control, it's probably rooted in anxiety of his own, and she can help both of them by asking after what it is he's "afraid" of or concerned will happen if he doesn't give directions For food behavior and weight loss: OK with [facility] or weight clinic or another program of choice., just apply herself.  If hormonal, monitor for mood instability. Continue to practice mindful eating, moments at least of slowing down to notice food sensations Apply self to some minium standard of cooking for health Continue deal with Mardelle Matte to hold/hide tempting foods  Delay strategies for hunger, when it comes, water first, movement first, anything else first To break down the tempting nature of chocolate, use imaginable disgust, e.g., figuring there are mice and rats in the factory, and the fact that you really can't know whether they got into the chocolate, and you can't tell by looking... Practice noticing and jotting down your feelings 3 times a day -- you can say as much or as little as you want about what's on your mind, but name feeling.  Use the feeling words chart to pick a word you don't use so often. Develop "menu" of things can do instead of munch -- painting, Youtube, greenhouse, walk, crochet, even TV -- 15-30 minutes before making any decision to  snack Still recommend daily journal of food behavior -- struggles and any partial victories, and especially affirm any time did something other than snack and it felt OK.  If logging food, be fully honest, don't leave anything out. Take own food to church dinners Make a habit of eating non-carbs first When indulging carbs/sweets, slow down and enjoy For feeling draggy, hitting the wall -- 1 tbsp MCT oil + 15 min Avoid super-low calorie targets and work for stability before trying to go lower Concur with rebalancing fat vs. protein calories Omega 3 for antiinflammatory benefit  Practice "just for today" perspective -- no big demands, do sobriety like the 12-step people do Education officer, community Anonymous for support and morale For self-esteem and mood: Practice making short work of shame -- e.g., you can have 1 minute to blame or shame, then stop and go do something that will actually improve the problem.  Remember -- the beating drives the eating. Self-affirm when taking care health or nutrition business, including doctor visits options for stress -- get hands busy, speak her feelings, vocalize/scream, clench hands 90 seconds, various crafts (crochet, greenhouse, others) Exercise as tolerated.  Outdoor breaks, 5-10 minutes if not  full walking Pain management Stretch back and legs for sciatic help Improve antiinflammatory strategies May try Epsom salt baths for muscle tension relief Apply relaxation methods despite pain OK to check with rheumatologist about diagnosis Probably need to update bedding Other recommendations/advice as may be noted above Continue to utilize previously learned skills ad lib Maintain medication as prescribed and work faithfully with relevant prescriber(s) if any changes are desired or seem indicated Call the clinic on-call service, 988/hotline, 911, or present to [facility] or ER if any life-threatening psychiatric crisis Return for as already scheduled. Already scheduled  visit in this office 04/15/2022.  Blanchie Serve, PhD Luan Moore, PhD LP Clinical Psychologist, [facility] [address] [place], [place] [address] [phone]

## 2022-04-15 ENCOUNTER — Ambulatory Visit (INDEPENDENT_AMBULATORY_CARE_PROVIDER_SITE_OTHER): Payer: MEDICARE | Admitting: Psychiatry

## 2022-04-15 DIAGNOSIS — M797 Fibromyalgia: Secondary | ICD-10-CM | POA: Diagnosis not present

## 2022-04-15 DIAGNOSIS — F909 Attention-deficit hyperactivity disorder, unspecified type: Secondary | ICD-10-CM

## 2022-04-15 DIAGNOSIS — F411 Generalized anxiety disorder: Secondary | ICD-10-CM

## 2022-04-15 DIAGNOSIS — Z638 Other specified problems related to primary support group: Secondary | ICD-10-CM

## 2022-04-15 DIAGNOSIS — F410 Panic disorder [episodic paroxysmal anxiety] without agoraphobia: Secondary | ICD-10-CM

## 2022-04-15 DIAGNOSIS — F341 Dysthymic disorder: Secondary | ICD-10-CM | POA: Diagnosis not present
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
## 2022-05-14 ENCOUNTER — Ambulatory Visit: Payer: MEDICARE | Admitting: Psychiatry

## 2022-06-14 ENCOUNTER — Ambulatory Visit (INDEPENDENT_AMBULATORY_CARE_PROVIDER_SITE_OTHER): Payer: MEDICARE | Admitting: Psychiatry

## 2022-06-14 DIAGNOSIS — Z638 Other specified problems related to primary support group: Secondary | ICD-10-CM | POA: Diagnosis not present

## 2022-06-14 DIAGNOSIS — F909 Attention-deficit hyperactivity disorder, unspecified type: Secondary | ICD-10-CM

## 2022-06-14 DIAGNOSIS — M797 Fibromyalgia: Secondary | ICD-10-CM | POA: Diagnosis not present

## 2022-06-14 DIAGNOSIS — F411 Generalized anxiety disorder: Secondary | ICD-10-CM

## 2022-06-14 DIAGNOSIS — F410 Panic disorder [episodic paroxysmal anxiety] without agoraphobia: Secondary | ICD-10-CM
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
## 2022-07-15 ENCOUNTER — Ambulatory Visit (INDEPENDENT_AMBULATORY_CARE_PROVIDER_SITE_OTHER): Payer: MEDICARE | Admitting: Psychiatry

## 2022-07-15 DIAGNOSIS — F909 Attention-deficit hyperactivity disorder, unspecified type: Secondary | ICD-10-CM | POA: Diagnosis not present

## 2022-07-15 DIAGNOSIS — Z638 Other specified problems related to primary support group: Secondary | ICD-10-CM | POA: Diagnosis not present

## 2022-07-15 DIAGNOSIS — M797 Fibromyalgia: Secondary | ICD-10-CM

## 2022-07-15 DIAGNOSIS — F411 Generalized anxiety disorder: Secondary | ICD-10-CM

## 2022-07-15 DIAGNOSIS — F341 Dysthymic disorder: Secondary | ICD-10-CM

## 2022-07-15 DIAGNOSIS — F5089 Other specified eating disorder: Secondary | ICD-10-CM

## 2022-07-15 DIAGNOSIS — F410 Panic disorder [episodic paroxysmal anxiety] without agoraphobia: Secondary | ICD-10-CM
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
## 2022-08-14 ENCOUNTER — Encounter: Payer: Self-pay | Admitting: Allergy and Immunology

## 2022-08-14 ENCOUNTER — Ambulatory Visit (INDEPENDENT_AMBULATORY_CARE_PROVIDER_SITE_OTHER): Payer: MEDICARE | Admitting: Psychiatry

## 2022-08-14 ENCOUNTER — Ambulatory Visit (INDEPENDENT_AMBULATORY_CARE_PROVIDER_SITE_OTHER): Payer: MEDICARE | Admitting: Allergy and Immunology

## 2022-08-14 VITALS — BP 124/82 | HR 80 | Resp 16 | Ht 63.0 in | Wt 182.2 lb

## 2022-08-14 DIAGNOSIS — R519 Headache, unspecified: Secondary | ICD-10-CM

## 2022-08-14 DIAGNOSIS — F341 Dysthymic disorder: Secondary | ICD-10-CM | POA: Diagnosis not present

## 2022-08-14 DIAGNOSIS — F909 Attention-deficit hyperactivity disorder, unspecified type: Secondary | ICD-10-CM

## 2022-08-14 DIAGNOSIS — J3089 Other allergic rhinitis: Secondary | ICD-10-CM | POA: Diagnosis not present

## 2022-08-14 DIAGNOSIS — Z638 Other specified problems related to primary support group: Secondary | ICD-10-CM

## 2022-08-14 DIAGNOSIS — F5089 Other specified eating disorder: Secondary | ICD-10-CM

## 2022-08-14 DIAGNOSIS — M797 Fibromyalgia: Secondary | ICD-10-CM

## 2022-08-14 DIAGNOSIS — K219 Gastro-esophageal reflux disease without esophagitis: Secondary | ICD-10-CM

## 2022-08-14 DIAGNOSIS — F411 Generalized anxiety disorder: Secondary | ICD-10-CM | POA: Diagnosis not present

## 2022-08-14 DIAGNOSIS — F410 Panic disorder [episodic paroxysmal anxiety] without agoraphobia: Secondary | ICD-10-CM

## 2022-08-14 DIAGNOSIS — H04123 Dry eye syndrome of bilateral lacrimal glands: Secondary | ICD-10-CM

## 2022-08-14 MED ORDER — RYALTRIS 665-25 MCG/ACT NA SUSP: NASAL | 5 refills | Status: DC

## 2022-08-14 MED ORDER — OMEPRAZOLE 40 MG PO CPDR: 40.0000 mg | DELAYED_RELEASE_CAPSULE | Freq: Two times a day (BID) | ORAL | 5 refills | Status: DC

## 2022-08-14 NOTE — Progress Notes (Signed)
Psychotherapy Progress Note [facility] [name], PhD LP  Patient ID: Amy Spence)    MRN: [id number] Therapy format: Individual psychotherapy Date: 08/14/2022      Start: 3:14p     Stop: 4:01p     Time Spent: 47 min Location: In-person   Session narrative (presenting needs, interim history, self-report of stressors and symptoms, applications of prior therapy, status changes, and interventions made in session) Saw allergist today -- no identified allergies, but working ideas about chocolate and caffeine to explain her sinus congestion.  Advised to limit throat-clearing, go on omeprazole, and limit chocolate and caffeine.    MRI found spinal stenosis and "osteoporosis" (scoliosis, actually), better explaining her persistent back pain.  In PT already, and starting massage.  Admittedly got too sedentary, including more so when sick on [place].  Tried a [employer] holiday but still nauseous on the starter dose.  Went on [employer]ystem but lost food in a freezer failure, so decided to just go back to organizing her own weight loss.  Reaffirmed carb control and mindful eating practices.  Notes being on gabapentin a long time, and daughter suspects it is driving memory trouble.  Agreed can bring that up with pain management (Dr. Drucie Ip) when she sees them next week.  Had nerve ablation already.  Agrees also that she wants to prioritize PT disciplines, reconditioning, and TENS unit.  Oriented to possibility of an antidepressant, topicals, and magnesium supplementation.  For general ortho pain and fibromyalgia, agreed to take a hot soak, could add lavender and Epsom salt for add benefit.    Excited to have her granddaughter's softball team win the state 2A.  The girl apparently overcame Rett's syndrome, attribute to a miracle of God.  Has a cousin in [place], recent news of advanced cancer.  Support/empathy provided.   Therapeutic modalities: Cognitive Behavioral Therapy,  Solution-Oriented/Positive Psychology, Ego-Supportive, and Psycho-education/Bibliotherapy  Mental Status/Observations:  Appearance:   Casual     Behavior:  Appropriate  Motor:  Normal  Speech/Language:   Clear and Coherent  Affect:  Appropriate  Mood:  Variable, better  Thought process:  normal  Thought content:    WNL  Sensory/Perceptual disturbances:    WNL  Orientation:  Fully oriented  Attention:  Good    Concentration:  Fair  Memory:  WNL  Insight:    Good  Judgment:   Good  Impulse Control:  Fair   Risk Assessment: Danger to Self: No Self-injurious Behavior: No Danger to Others: No Physical Aggression / Violence: No Duty to Warn: No Access to Firearms a concern: No  Assessment of progress:  progressing  Diagnosis:   ICD-10-CM   1. Generalized anxiety disorder with panic attacks  F41.1    F41.0     2. Dysthymia  F34.1     3. Attention deficit hyperactivity disorder (ADHD), unspecified ADHD type -- by hx  F90.9     4. Relationship problem with family member (sister)  Z47.8     5. Compulsive overeating  F50.89     6. Fibromyalgia  M79.7      Plan:  For boundaries and family pressures: Self-affirm OK to say her own yes or not to any time, interactions Watch own tendency to resent and release as able Try playful and inquisitive assertiveness techniques with husband's overdirection habit.  Remember, like most folks who seem to overdo control, it's probably rooted in anxiety of his own, and she can help both of them by asking after what  it is he's "afraid" of or concerned will happen if he doesn't give directions With [name], OK to check hypotheses about what things mean to him, support him getting to services.  OK to subsidize his living, be ready to [place] the support as leverage to acknowledge blocks to recovery, including shame and transference reactions For food behavior and weight loss: OK with [facility] or weight clinic or another program of choice., just apply herself.   If hormonal, monitor for mood instability. Continue to practice mindful eating, moments at least of slowing down to notice food sensations Apply self to some minimum standard of cooking for health Continue deal with Mardelle Matte to hold/hide tempting foods  Delay strategies for hunger, when it comes, water first, movement first, anything else first To break down particularly tempting foods (e.g., chocolate), use imaginable disgust, e.g., figuring there are mice and rats in the factory, and the fact that you really can't know whether they got into the food, and you can't tell by looking... Practice emotional acknowledgment -- noticing and jotting down your feelings 3 times a day -- say as much or as little as you want about what's on your mind, but name the feelings.  Use the feeling words chart to pick a word you don't use so often. Develop "menu" of things can do instead of munch -- painting, Youtube, greenhouse, walk, crochet, even TV -- 15-30 minutes before making any decision to snack Still recommend daily journal of food behavior -- struggles and any partial victories, and especially affirm any time did something other than snack and it felt OK.  If logging food, be fully honest, don't leave anything out. Take own food to church dinners Carb control -- non-carbs first, reduce overall carb load, slow down and enjoy For feeling draggy, hitting the wall -- 1 tbsp MCT oil + 15 min Avoid super-low calorie targets and work for stability before trying to go lower Concur with rebalancing fat vs. protein calories Omega 3 for antiinflammatory benefit  Practice "just for today" perspective -- no big demands, do sobriety like the 12-step people do Education officer, community Anonymous for support and morale For self-esteem and mood: Practice making short work of shame -- e.g., you can have 1 minute to blame or shame, then stop and go do something that will actually improve the problem.  Remember -- the beating drives the  eating. Self-affirm when taking care health or nutrition business, including doctor visits options for stress -- get hands busy, speak her feelings, vocalize/scream, clench hands 90 seconds, various crafts (crochet, greenhouse, others) Exercise as tolerated.  Outdoor breaks, 5-10 minutes if not full walking Catch body shame/loathing and stop, don't rehearse it Medical anxiety -- Practice thinking of procedures as her opportunity to get hired help to see things she can't see for herself, are well-trained to overlook the things she considers embarrassing, and are plenty able to take on her body with the kindness, acceptance, and grace she wants and already shows other people Pain management Stretch back and legs for sciatic help May try Epsom salt baths for muscle tension relief for muscle and pain soothing, absorption of magnesium, and available aromatherapy Apply relaxation methods despite pain Probably need to update bedding Walk anyway, when pain allows; when it doesn't, stretch some Take up suggestion to go to a senior center chair yoga class Self-refer to rheumatology (e.g., Jeananne Rama, MD) to assess/update fibromyalgia vs. polymyalgia rheumatica vs. something else improve intake of antioxidants and antiinflammatory nutrients -- omega 3, green  veg in particular, possibly biotin and/or B complex as noted for hair benefit Endorse PT and somatic treatments beyond medication Other recommendations/advice -- As may be noted above.  Continue to utilize previously learned skills ad lib. Medication compliance -- Maintain medication as prescribed and work faithfully with relevant prescriber(s) if any changes are desired or seem indicated. Crisis service -- Aware of call list and work-in appts.  Call the clinic on-call service, 988/hotline, 911, or present to [facility] or ER if any life-threatening psychiatric crisis. Followup -- Return for time as already scheduled.  Next scheduled visit with me  09/24/2022.  Next scheduled in this office 09/24/2022.  Robley Fries, PhD [name], PhD LP Clinical Psychologist, [facility] [address] [place], [place] [address] [phone]

## 2022-08-14 NOTE — Progress Notes (Unsigned)
[place] - [place] - [place] - [place] - [place]   Dear Amy Spence,  Thank you for referring Amy Spence to the [facility] [place] on 08/14/2022.   Below is a summation of this patient's evaluation and recommendations.  Thank you for your referral. I will keep you informed about this patient's response to treatment.   If you have any questions please do not hesitate to contact me.   Sincerely,  Amy Priest, MD Allergy / Immunology [facility]   ______________________________________________________________________    NEW PATIENT NOTE  Referring Provider: Olive Bass, MD Primary Provider: Olive Bass, MD Date of office visit: 08/14/2022    Subjective:   Chief Complaint:  Amy Spence (DOB: 1955-06-07) is a 67 y.o. female who presents to the clinic on 08/14/2022 with a chief complaint of Allergies and Sinus Problem .     HPI: Amy Spence to this clinic in evaluation of several issues.  First, she has issues with sniffing and snorting and sneezing and stuffiness without any anosmia occurring on a perennial basis with spring and fall exacerbation that developed during adulthood and has been a progressive issue over the course of the past decade.  She has tried Flonase in the past which has not helped her at all.  She also has itchy eyes in the context of having dry eye syndrome.  Second, she gets "sinusitis" 3 times per year manifested as significant head pressure and headache for which she will be treated with an antibiotic and sometimes prednisone.  She does appear to have a daily sinus pressure phenomenon involving her forehead and face unassociated with any neurological or migraine features.  Third, she has lots of drainage in her throat and lots of throat clearing and some cough usually because of throat drainage.  She does have reflux and she is using omeprazole  20 mg a day and if she overeats she will regurgitate even while using this proton pump inhibitor.  She drinks 2 caffeinated sodas per day and has chocolate on a daily basis.  She has had a upper endoscopy completed in 2023.  Fourth, she has ear ringing bilaterally for many years.  She states that her ears feel full.  She does not really have any vertigo and she has no hearing loss.  She does have some balance issues which appears to be secondary to weakness as she believes that she is lost some muscle mass over the course of the past decade.  Fifth, she apparently has some memory issues and she has had evaluation with a neurologist prior to 2018.  She has had a sleep study prior to 2014 for some issues with snoring and her memory issues but apparently she states that her sleep apnea was very mild and no therapy was prescribed.  She does have trouble maintaining sleep but she uses a sleep aid which appears to take care of that issue.  She does snore but does not have any classic apneic symptoms.  Past Medical History:  Diagnosis Date   Anemia    Anxiety    Arthritis    DDD (degenerative disc disease), lumbar    Depression    Erosive gastritis    Fibromyalgia    GERD (gastroesophageal reflux disease)    Pneumonia     Past Surgical History:  Procedure Laterality Date   BREAST SURGERY     augmentation   EYE  SURGERY     eye lid   KNEE ARTHROSCOPY     REVERSE SHOULDER ARTHROPLASTY Right 12/03/2018   Procedure: REVERSE SHOULDER ARTHROPLASTY;  Surgeon: Francena Hanly, MD;  Location: [facility];  Service: Orthopedics;  Laterality: Right;    TONSILLECTOMY     TOTAL KNEE ARTHROPLASTY Right 05/16/2020   Procedure: TOTAL KNEE ARTHROPLASTY;  Surgeon: Durene Romans, MD;  Location: [facility];  Service: Orthopedics;  Laterality: Right;  70 mins   TUBAL LIGATION      Allergies as of 08/14/2022       Reactions   Nitrofurantoin Itching   Latex Rash   Minor irriation        Medication List     atorvastatin 20 MG tablet Commonly known as: LIPITOR Take 20 mg by mouth daily.   B-12 5000 MCG Subl Place 5,000 mcg under the tongue daily.   buPROPion 150 MG 24 hr tablet Commonly known as: WELLBUTRIN XL Take 450 mg by mouth daily.   cephALEXin 250 MG capsule Commonly known as: KEFLEX Take 250 mg by mouth daily.   docusate sodium 100 MG capsule Commonly known as: COLACE Take 100 mg by mouth every 8 (eight) hours as needed for mild constipation.   finasteride 5 MG tablet Commonly known as: PROSCAR Take 2.5 mg by mouth daily. Hair Loss   fluticasone 50 MCG/ACT nasal spray Commonly known as: FLONASE Place 1 spray into both nostrils daily as needed for allergies.   gabapentin 400 MG capsule Commonly known as: NEURONTIN Take 400 mg by mouth 3 (three) times daily.   Gemtesa 75 MG Tabs Generic drug: Vibegron Take 1 tablet by mouth daily.   glycerin adult 2 g suppository Place 1 suppository rectally daily as needed for constipation.   hydrOXYzine 10 MG tablet Commonly known as: ATARAX Take 10 mg by mouth 3 (three) times daily as needed for anxiety.   lidocaine 5 % Commonly known as: LIDODERM Apply patch to painful area. Patch may remain in place for up to 12 hours in a 24 hour period.   meloxicam 15 MG tablet Commonly known as: MOBIC Take 15 mg by mouth daily.   omeprazole 20 MG capsule Commonly known as: PRILOSEC TAKE ONE CAPSULE BY MOUTH DAILY IN THE MORNING BEFORE BREAKFAST   prenatal multivitamin Tabs tablet Take 1 tablet by mouth daily at 12 noon.   sertraline 100 MG tablet Commonly known as: ZOLOFT Take 200 mg by mouth daily.   Soothe XP Soln Place 1 drop into both eyes 2 (two) times daily as needed (dry eyes).   spironolactone 50 MG tablet Commonly known as: ALDACTONE Take 50 mg by mouth daily.   Vitamin D 50 MCG (2000 UT) tablet Take 2,000 Units by mouth daily.   Xiidra 5 % Soln Generic drug: Lifitegrast Apply to eye.   zinc gluconate  50 MG tablet Take 50 mg by mouth daily.   ZyrTEC Allergy 10 MG Caps Generic drug: Cetirizine HCl Take 10 mg by mouth daily.    Review of systems negative except as noted in HPI / PMHx or noted below:  Review of Systems  Constitutional: Negative.   HENT: Negative.    Eyes: Negative.   Respiratory: Negative.    Cardiovascular: Negative.   Gastrointestinal: Negative.   Genitourinary: Negative.   Musculoskeletal: Negative.   Skin: Negative.   Neurological: Negative.   Endo/Heme/Allergies: Negative.   Psychiatric/Behavioral: Negative.      Family History  Problem Relation Age of Onset   Arthritis  Mother    Dementia Mother    Dementia Father    Arthritis Sister    Arthritis Sister    Arthritis Brother    Breast cancer Paternal Aunt    Heart attack Maternal Grandfather    Irritable bowel syndrome Daughter     Social History   Socioeconomic History   Marital status: Married    Spouse name: Not on file   Number of children: 3   Years of education: Not on file   Highest education level: Not on file  Occupational History   Occupation: Home maker  Tobacco Use   Smoking status: Former    Packs/day: 1.00    Years: 3.00    Additional pack years: 0.00    Total pack years: 3.00    Types: Cigarettes    Quit date: 1978    Years since quitting: 46.4   Smokeless tobacco: Never   Tobacco comments:    as a teen  Building services engineer Use: Never used  Substance and Sexual Activity   Alcohol use: No   Drug use: No   Sexual activity: Yes  Other Topics Concern   Not on file  Social History Narrative   Not on file   Environmental and Social history  Lives in a house with a dry environment, a dog located inside the household, no carpet in the bedroom, plastic on the bed, no plastic on the pillow, no smoking ongoing with inside the household.  Objective:   Vitals:   08/14/22 0917  BP: 124/82  Pulse: 80  Resp: 16  SpO2: 98%   Height: 5\' 3"  (160 cm) Weight: 182 lb  3.2 oz (82.6 kg)  Physical Exam Constitutional:      Appearance: She is not diaphoretic.     Comments: Nasal voice, throat clearing  HENT:     Head: Normocephalic.     Right Ear: Tympanic membrane, ear canal and external ear normal.     Left Ear: Tympanic membrane, ear canal and external ear normal.     Nose: Nose normal. No mucosal edema or rhinorrhea.     Mouth/Throat:     Pharynx: Uvula midline. No oropharyngeal exudate.  Eyes:     Conjunctiva/sclera: Conjunctivae normal.  Neck:     Thyroid: No thyromegaly.     Trachea: Trachea normal. No tracheal tenderness or tracheal deviation.  Cardiovascular:     Rate and Rhythm: Normal rate and regular rhythm.     Heart sounds: Normal heart sounds, S1 normal and S2 normal. No murmur heard. Pulmonary:     Effort: No respiratory distress.     Breath sounds: Normal breath sounds. No stridor. No wheezing or rales.  Lymphadenopathy:     Head:     Right side of head: No tonsillar adenopathy.     Left side of head: No tonsillar adenopathy.     Cervical: No cervical adenopathy.  Skin:    Findings: No erythema or rash.     Nails: There is no clubbing.  Neurological:     Mental Status: She is alert.     Diagnostics: Allergy skin tests were performed.  She did not demonstrate any hypersensitivity against a screening panel of aeroallergens.  Assessment and Plan:    1. Perennial allergic rhinitis   2. Chronic daily headache   3. LPRD (laryngopharyngeal reflux disease)   4. Dry eye syndrome of both eyes     1. Allergen avoidance measures???  2. Treat and prevent inflammation of  airway:   A. Ryaltris - 2 sprays each nostril 1-2 times per day   3. Treat and prevent reflux induced airway inflammation:   A. Omeprazole 40 mg - 1 tablet 2 times per day  B. Decrease caffeine and chocolate consumption  C. Replace throat clearing with swallowing / drinking maneuver  4. Treat and prevent chronic daily headache / face pressure:  A.  Decrease caffeine and chocolate consumption  5. If needed:   A. OTC antihistamine - Dry Eye???  B. Systane eye drops multiple times per day  C. Pataday - 1 drop each eye 1 time per day  6. Return to clinic in 4 weeks or earlier if problem  7. Further evaluation and treatment???  Amy Spence has a inflamed and irritated airway and one of the triggers giving rise to this issue may be reflux and in addition to having her use a anti-inflammatory agent for her upper airway we will have her aggressively treat reflux as noted above.  And she has some form of chronic daily headache/facial pain syndrome that may also be tied up with her consumption of caffeine and chocolate and I have asked her to minimize this exposure.  She has dry eye syndrome and I am not sure she is going to be able to tolerate oral antihistamines given this issue.  She will use the plan noted above and I will regroup with her in 4 weeks to consider further evaluation and treatment.  The 1 issue we are not addressing today is her history of "mild" sleep apnea and should she still continues to have headache on a consistent basis we may need to review that previous sleep study results and consider further evaluation for this issue.  Amy Priest, MD Allergy / Immunology [facility] [place]

## 2022-08-14 NOTE — Patient Instructions (Addendum)
  1. Allergen avoidance measures???  2. Treat and prevent inflammation of airway:   A. Ryaltris - 2 sprays each nostril 1-2 times per day   3. Treat and prevent reflux induced airway inflammation:   A. Omeprazole 40 mg - 1 tablet 2 times per day  B. Decrease caffeine and chocolate consumption  C. Replace throat clearing with swallowing / drinking maneuver  4. Treat and prevent chronic daily headache / face pressure:  A. Decrease caffeine and chocolate consumption  5. If needed:   A. OTC antihistamine - Dry Eye???  B. Systane eye drops multiple times per day  C. Pataday - 1 drop each eye 1 time per day  6. Return to clinic in 4 weeks or earlier if problem  7. Further evaluation and treatment???

## 2022-09-11 ENCOUNTER — Encounter: Payer: Self-pay | Admitting: Allergy and Immunology

## 2022-09-11 ENCOUNTER — Ambulatory Visit (INDEPENDENT_AMBULATORY_CARE_PROVIDER_SITE_OTHER): Payer: MEDICARE | Admitting: Allergy and Immunology

## 2022-09-11 VITALS — BP 118/72 | HR 85 | Resp 16

## 2022-09-11 DIAGNOSIS — K219 Gastro-esophageal reflux disease without esophagitis: Secondary | ICD-10-CM

## 2022-09-11 DIAGNOSIS — R519 Headache, unspecified: Secondary | ICD-10-CM | POA: Diagnosis not present

## 2022-09-11 DIAGNOSIS — J3089 Other allergic rhinitis: Secondary | ICD-10-CM | POA: Diagnosis not present

## 2022-09-11 DIAGNOSIS — H04123 Dry eye syndrome of bilateral lacrimal glands: Secondary | ICD-10-CM | POA: Diagnosis not present

## 2022-09-11 MED ORDER — CYPROHEPTADINE HCL 4 MG PO TABS: ORAL_TABLET | ORAL | 5 refills | Status: DC

## 2022-09-11 NOTE — Progress Notes (Signed)
[place] - [place] - [place] - [place] - [place]   Follow-up Note  Referring Provider: Olive Bass, MD Primary Provider: Olive Bass, MD Date of Office Visit: 09/11/2022  Subjective:   Amy Spence (DOB: 1955-08-19) is a 67 y.o. female [facility] in re-evaluation of the following:  HPI: Amy Spence returns to this clinic in reevaluation of rhinitis, chronic daily headache, LPR, dry eye.  I last saw her in this clinic during her initial evaluation of 14 August 2022.  She is better in some ways.  Her throat has improved significantly as has her classic reflux at this point in time and she is not bothered by throat clearing and cough and throat drainage.  She does not have any classic reflux symptoms.  She is using her omeprazole just 1 time per day at this point.  She has consolidated her caffeine and her chocolate consumption.  But she still has this daily head pressure issue and feels as though her face and her ears are full even though she has been using her combination nasal steroid/antihistamine spray on a consistent basis.  And she still continues to have ringing in her years.  Allergies as of 09/11/2022       Reactions   Nitrofurantoin Itching   Latex Rash   Minor irriation        Medication List    atorvastatin 20 MG tablet Commonly known as: LIPITOR Take 20 mg by mouth daily.   B-12 5000 MCG Subl Place 5,000 mcg under the tongue daily.   buPROPion 150 MG 24 hr tablet Commonly known as: WELLBUTRIN XL Take 450 mg by mouth daily.   cephALEXin 250 MG capsule Commonly known as: KEFLEX Take 250 mg by mouth daily.   docusate sodium 100 MG capsule Commonly known as: COLACE Take 100 mg by mouth every 8 (eight) hours as needed for mild constipation.   finasteride 5 MG tablet Commonly known as: PROSCAR Take 2.5 mg by mouth daily. Hair Loss   gabapentin 400 MG capsule Commonly known as: NEURONTIN Take  400 mg by mouth 3 (three) times daily.   Gemtesa 75 MG Tabs Generic drug: Vibegron Take 1 tablet by mouth daily.   glycerin adult 2 g suppository Place 1 suppository rectally daily as needed for constipation.   hydrOXYzine 10 MG tablet Commonly known as: ATARAX Take 10 mg by mouth 3 (three) times daily as needed for anxiety.   lidocaine 5 % Commonly known as: LIDODERM Apply patch to painful area. Patch may remain in place for up to 12 hours in a 24 hour period.   meloxicam 15 MG tablet Commonly known as: MOBIC Take 15 mg by mouth daily.   omeprazole 40 MG capsule Commonly known as: PRILOSEC Take 1 capsule (40 mg total) by mouth 2 (two) times daily.   prenatal multivitamin Tabs tablet Take 1 tablet by mouth daily at 12 noon.   Ryaltris 161-09 MCG/ACT Susp Generic drug: Olopatadine-Mometasone 2 sprays each nostril 1-2 times per day   sertraline 100 MG tablet Commonly known as: ZOLOFT Take 200 mg by mouth daily.   Soothe XP Soln Place 1 drop into both eyes 2 (two) times daily as needed (dry eyes).   spironolactone 50 MG tablet Commonly known as: ALDACTONE Take 50 mg by mouth daily.   Vitamin D 50 MCG (2000 UT) tablet Take 2,000 Units by mouth daily.   Xiidra 5 % Soln Generic drug: Lifitegrast  Apply to eye.   zinc gluconate 50 MG tablet Take 50 mg by mouth daily.   ZyrTEC Allergy 10 MG Caps Generic drug: Cetirizine HCl Take 10 mg by mouth daily.    Past Medical History:  Diagnosis Date   Anemia    Anxiety    Arthritis    DDD (degenerative disc disease), lumbar    Depression    Erosive gastritis    Fibromyalgia    GERD (gastroesophageal reflux disease)    Pneumonia     Past Surgical History:  Procedure Laterality Date   BREAST SURGERY     augmentation   EYE SURGERY     eye lid   KNEE ARTHROSCOPY     REVERSE SHOULDER ARTHROPLASTY Right 12/03/2018   Procedure: REVERSE SHOULDER ARTHROPLASTY;  Surgeon: Francena Hanly, MD;  Location: [facility];   Service: Orthopedics;  Laterality: Right;    TONSILLECTOMY     TOTAL KNEE ARTHROPLASTY Right 05/16/2020   Procedure: TOTAL KNEE ARTHROPLASTY;  Surgeon: Durene Romans, MD;  Location: [facility];  Service: Orthopedics;  Laterality: Right;  70 mins   TUBAL LIGATION      Review of systems negative except as noted in HPI / PMHx or noted below:  Review of Systems  Constitutional: Negative.   HENT: Negative.    Eyes: Negative.   Respiratory: Negative.    Cardiovascular: Negative.   Gastrointestinal: Negative.   Genitourinary: Negative.   Musculoskeletal: Negative.   Skin: Negative.   Neurological: Negative.   Endo/Heme/Allergies: Negative.   Psychiatric/Behavioral: Negative.       Objective:   Vitals:   09/11/22 1530  BP: 118/72  Pulse: 85  Resp: 16  SpO2: 96%          Physical Exam Constitutional:      Appearance: She is not diaphoretic.  HENT:     Head: Normocephalic.     Right Ear: Tympanic membrane, ear canal and external ear normal.     Left Ear: Tympanic membrane, ear canal and external ear normal.     Nose: Nose normal. No mucosal edema or rhinorrhea.     Mouth/Throat:     Pharynx: Uvula midline. No oropharyngeal exudate.  Eyes:     Conjunctiva/sclera: Conjunctivae normal.  Neck:     Thyroid: No thyromegaly.     Trachea: Trachea normal. No tracheal tenderness or tracheal deviation.  Cardiovascular:     Rate and Rhythm: Normal rate and regular rhythm.     Heart sounds: Normal heart sounds, S1 normal and S2 normal. No murmur heard. Pulmonary:     Effort: No respiratory distress.     Breath sounds: Normal breath sounds. No stridor. No wheezing or rales.  Lymphadenopathy:     Head:     Right side of head: No tonsillar adenopathy.     Left side of head: No tonsillar adenopathy.     Cervical: No cervical adenopathy.  Skin:    Findings: No erythema or rash.     Nails: There is no clubbing.  Neurological:     Mental Status: She is alert.      Diagnostics: none  Assessment and Plan:   1. Perennial allergic rhinitis   2. Dry eye syndrome of both eyes   3. Chronic daily headache   4. LPRD (laryngopharyngeal reflux disease)    1. Blood - area 2 aeroallergen profile   2. Review brain MRI result 2023  3. Treat and prevent inflammation of airway:   A. Ryaltris - 2 sprays  each nostril 2 times per day   4. Treat and prevent reflux induced airway inflammation:   A. Omeprazole 40 mg - 1 tablet 1-2 times per day  B. Minimize caffeine and chocolate consumption  C. Replace throat clearing with swallowing / drinking maneuver  5. Treat and prevent chronic daily headache / face pressure:  A. Decrease caffeine and chocolate consumption B. Start periactin 4 mg - 1/2-1 tablet at bedtime  6. If needed:   A. Systane eye drops multiple times per day  C. Pataday - 1 drop each eye 1 time per day  7. Return to clinic in 12 weeks or earlier if problem  Vernalee is better in regard to her reflux issue and her throat issue but she still has this daily head pressure phenomena and face pressure phenomena even though she is using anti-inflammatory agents for her upper airway.  I am going to review her brain MRI that was performed in 2023 to see if there is any evidence of chronic sinusitis.  Will start her on Periactin as a preventative for migraine variant.  I will contact her with the results of her blood test which will be examining for the role of atopic disease contributing to some of her symptoms.  Laurette Schimke, MD Allergy / Immunology [facility]

## 2022-09-11 NOTE — Patient Instructions (Addendum)
  1. Blood - area 2 aeroallergen profile   2. Review brain MRI result 2023  3. Treat and prevent inflammation of airway:   A. Ryaltris - 2 sprays each nostril 2 times per day   4. Treat and prevent reflux induced airway inflammation:   A. Omeprazole 40 mg - 1 tablet 1-2 times per day  B. Minimize caffeine and chocolate consumption  C. Replace throat clearing with swallowing / drinking maneuver  5. Treat and prevent chronic daily headache / face pressure:  A. Decrease caffeine and chocolate consumption B. Start periactin 4 mg - 1/2-1 tablet at bedtime  6. If needed:   A. Systane eye drops multiple times per day  C. Pataday - 1 drop each eye 1 time per day  7. Return to clinic in 12 weeks or earlier if problem

## 2022-09-15 LAB — ALLERGENS W/TOTAL IGE AREA 2

## 2022-09-16 ENCOUNTER — Encounter: Payer: Self-pay | Admitting: Allergy and Immunology

## 2022-09-16 ENCOUNTER — Telehealth: Payer: Self-pay | Admitting: Allergy and Immunology

## 2022-09-16 NOTE — Telephone Encounter (Signed)
Patient informed. She will continue to take periactin and let us know if there are any changes on her weight

## 2022-09-16 NOTE — Telephone Encounter (Signed)
Patient is requesting a call back from a nurse. She has some concerns regarding Periactin. She read that it causes weight gain but her PCP told her she needed to lose weight.

## 2022-09-18 ENCOUNTER — Encounter: Payer: Self-pay | Admitting: *Deleted

## 2022-09-24 ENCOUNTER — Ambulatory Visit: Payer: MEDICARE | Admitting: Psychiatry

## 2022-10-25 ENCOUNTER — Ambulatory Visit: Payer: MEDICARE | Admitting: Psychiatry

## 2022-11-25 ENCOUNTER — Ambulatory Visit (INDEPENDENT_AMBULATORY_CARE_PROVIDER_SITE_OTHER): Payer: MEDICARE | Admitting: Psychiatry

## 2022-11-25 DIAGNOSIS — F341 Dysthymic disorder: Secondary | ICD-10-CM | POA: Diagnosis not present

## 2022-11-25 DIAGNOSIS — F411 Generalized anxiety disorder: Secondary | ICD-10-CM | POA: Diagnosis not present

## 2022-11-25 DIAGNOSIS — F410 Panic disorder [episodic paroxysmal anxiety] without agoraphobia: Secondary | ICD-10-CM

## 2022-11-25 DIAGNOSIS — M797 Fibromyalgia: Secondary | ICD-10-CM

## 2022-11-25 DIAGNOSIS — F909 Attention-deficit hyperactivity disorder, unspecified type: Secondary | ICD-10-CM | POA: Diagnosis not present

## 2022-11-25 DIAGNOSIS — Z638 Other specified problems related to primary support group: Secondary | ICD-10-CM

## 2022-11-25 DIAGNOSIS — F5089 Other specified eating disorder: Secondary | ICD-10-CM | POA: Diagnosis not present
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
## 2022-12-04 ENCOUNTER — Ambulatory Visit: Payer: MEDICARE | Admitting: Allergy and Immunology

## 2022-12-11 ENCOUNTER — Ambulatory Visit: Payer: MEDICARE | Admitting: Allergy and Immunology

## 2022-12-25 ENCOUNTER — Ambulatory Visit: Payer: MEDICARE | Admitting: Psychiatry

## 2023-01-01 ENCOUNTER — Encounter: Payer: Self-pay | Admitting: Allergy and Immunology

## 2023-01-01 ENCOUNTER — Ambulatory Visit (INDEPENDENT_AMBULATORY_CARE_PROVIDER_SITE_OTHER): Payer: MEDICARE | Admitting: Allergy and Immunology

## 2023-01-01 VITALS — BP 134/84 | HR 84 | Resp 16

## 2023-01-01 DIAGNOSIS — K219 Gastro-esophageal reflux disease without esophagitis: Secondary | ICD-10-CM

## 2023-01-01 DIAGNOSIS — R519 Headache, unspecified: Secondary | ICD-10-CM

## 2023-01-01 DIAGNOSIS — J3089 Other allergic rhinitis: Secondary | ICD-10-CM

## 2023-01-01 DIAGNOSIS — H04123 Dry eye syndrome of bilateral lacrimal glands: Secondary | ICD-10-CM

## 2023-01-01 NOTE — Patient Instructions (Signed)
  1. Treat and prevent inflammation of airway:   A. Ryaltris - 2 sprays each nostril 2 times per day   2. Treat and prevent reflux induced airway inflammation:   A. Omeprazole 40 mg - 1 tablet 1-2 times per day  B. Minimize caffeine and chocolate consumption  C. Replace throat clearing with swallowing / drinking maneuver  3. Treat and prevent chronic daily headache / face pressure:  A. Decrease caffeine and chocolate consumption B. Periactin 4 mg - 1/2-1 tablet at bedtime  4. If needed:   A. Systane eye drops multiple times per day  C. Pataday - 1 drop each eye 1 time per day  5. Return to clinic in 6 months or earlier if problem

## 2023-01-01 NOTE — Progress Notes (Unsigned)
[place] - [place] - [place] - [place] - [place]   Follow-up Note  Referring Provider: Olive Bass, MD Primary Provider: Olive Bass, MD Date of Office Visit: 01/01/2023  Subjective:   Amy Spence (DOB: March 02, 1956) is a 67 y.o. female who [facility]23 in re-evaluation of the following:  HPI: Amy Spence returns to this clinic in evaluation of rhinitis, chronic daily headache, LPR, dry eye.  I last saw her in this clinic 11 September 2022.  During her last visit we started her on Periactin to be used at bedtime.  This has resulted in significant improvement regarding her chronic daily headache and at this point she does not have a headache.  And she is sleeping really well.  She has had very little problems with her nose while using a combination nasal steroid nasal and histamine.  She believes that her reflux is doing a lot better as is her throat issue while using omeprazole twice a day.  Allergies as of 01/01/2023       Reactions   Nitrofurantoin Itching   Latex Rash   Minor irriation        Medication List    atorvastatin 20 MG tablet Commonly known as: LIPITOR Take 20 mg by mouth daily.   B-12 5000 MCG Subl Place 5,000 mcg under the tongue daily.   buPROPion 150 MG 24 hr tablet Commonly known as: WELLBUTRIN XL Take 450 mg by mouth daily.   cyproheptadine 4 MG tablet Commonly known as: PERIACTIN 1/2 -1 tablet at bedtime   docusate sodium 100 MG capsule Commonly known as: COLACE Take 100 mg by mouth every 8 (eight) hours as needed for mild constipation.   estradiol 0.1 MG/GM vaginal cream Commonly known as: ESTRACE   gabapentin 400 MG capsule Commonly known as: NEURONTIN Take 400 mg by mouth 3 (three) times daily.   gabapentin 100 MG capsule Commonly known as: NEURONTIN   glycerin adult 2 g suppository Place 1 suppository rectally daily as needed for constipation.   HAIR SKIN & NAILS PO Take  by mouth daily.   hydrOXYzine 10 MG tablet Commonly known as: ATARAX Take 10 mg by mouth 3 (three) times daily as needed for anxiety.   lidocaine 5 % Commonly known as: LIDODERM Apply patch to painful area. Patch may remain in place for up to 12 hours in a 24 hour period.   lisdexamfetamine 50 MG capsule Commonly known as: VYVANSE Take by mouth.   meloxicam 15 MG tablet Commonly known as: MOBIC Take 15 mg by mouth daily.   mirabegron ER 50 MG Tb24 tablet Commonly known as: MYRBETRIQ Take 50 mg by mouth daily.   MULTIVITAMIN PO Take by mouth daily.   omeprazole 40 MG capsule Commonly known as: PRILOSEC Take 1 capsule (40 mg total) by mouth 2 (two) times daily.   Ryaltris 952-84 MCG/ACT Susp Generic drug: Olopatadine-Mometasone 2 sprays each nostril 1-2 times per day   sertraline 100 MG tablet Commonly known as: ZOLOFT Take 200 mg by mouth daily.   Vitamin D 50 MCG (2000 UT) tablet Take 2,000 Units by mouth daily.   Xiidra 5 % Soln Generic drug: Lifitegrast Apply to eye.    Past Medical History:  Diagnosis Date   Allergic rhinitis    Anemia    Anxiety    Arthritis    DDD (degenerative disc disease), lumbar    Depression    Erosive gastritis    Fibromyalgia  GERD (gastroesophageal reflux disease)    Pneumonia     Past Surgical History:  Procedure Laterality Date   BREAST SURGERY     augmentation   EYE SURGERY     eye lid   KNEE ARTHROSCOPY     REVERSE SHOULDER ARTHROPLASTY Right 12/03/2018   Procedure: REVERSE SHOULDER ARTHROPLASTY;  Surgeon: Francena Hanly, MD;  Location: [facility];  Service: Orthopedics;  Laterality: Right;    TONSILLECTOMY     TOTAL KNEE ARTHROPLASTY Right 05/16/2020   Procedure: TOTAL KNEE ARTHROPLASTY;  Surgeon: Durene Romans, MD;  Location: [facility];  Service: Orthopedics;  Laterality: Right;  70 mins   TUBAL LIGATION      Review of systems negative except as noted in HPI / PMHx or noted below:  Review of Systems   Constitutional: Negative.   HENT: Negative.    Eyes: Negative.   Respiratory: Negative.    Cardiovascular: Negative.   Gastrointestinal: Negative.   Genitourinary: Negative.   Musculoskeletal: Negative.   Skin: Negative.   Neurological: Negative.   Endo/Heme/Allergies: Negative.   Psychiatric/Behavioral: Negative.       Objective:   Vitals:   01/01/23 1610  BP: 134/84  Pulse: 84  Resp: 16  SpO2: 94%          Physical Exam Constitutional:      Appearance: She is not diaphoretic.  HENT:     Head: Normocephalic.     Right Ear: Tympanic membrane, ear canal and external ear normal.     Left Ear: Tympanic membrane, ear canal and external ear normal.     Nose: Nose normal. No mucosal edema or rhinorrhea.     Mouth/Throat:     Pharynx: Uvula midline. No oropharyngeal exudate.  Eyes:     Conjunctiva/sclera: Conjunctivae normal.  Neck:     Thyroid: No thyromegaly.     Trachea: Trachea normal. No tracheal tenderness or tracheal deviation.  Cardiovascular:     Rate and Rhythm: Normal rate and regular rhythm.     Heart sounds: Normal heart sounds, S1 normal and S2 normal. No murmur heard. Pulmonary:     Effort: No respiratory distress.     Breath sounds: Normal breath sounds. No stridor. No wheezing or rales.  Lymphadenopathy:     Head:     Right side of head: No tonsillar adenopathy.     Left side of head: No tonsillar adenopathy.     Cervical: No cervical adenopathy.  Skin:    Findings: No erythema or rash.     Nails: There is no clubbing.  Neurological:     Mental Status: She is alert.     Diagnostics: Results of blood tests obtained 11 September 2022 identifies IgE 20 KU/L, no antigen specific IgE antibodies identified on an area two aero allergen profile.  Assessment and Plan:   1. Perennial allergic rhinitis   2. Chronic daily headache   3. Dry eye syndrome of both eyes   4. LPRD (laryngopharyngeal reflux disease)    1. Treat and prevent inflammation of  airway:   A. Ryaltris - 2 sprays each nostril 2 times per day   2. Treat and prevent reflux induced airway inflammation:   A. Omeprazole 40 mg - 1 tablet 1-2 times per day  B. Minimize caffeine and chocolate consumption  C. Replace throat clearing with swallowing / drinking maneuver  3. Treat and prevent chronic daily headache / face pressure:  A. Decrease caffeine and chocolate consumption B. Periactin 4 mg -  1/2-1 tablet at bedtime  4. If needed:   A. Systane eye drops multiple times per day  C. Pataday - 1 drop each eye 1 time per day  5. Return to clinic in 6 months or earlier if problem  Corby appears to be doing very well at this point in time while using anti-inflammatory agents for airway, therapy directed against LPR, and Periactin for her chronic daily headache.  Will keep her on this plan and I will see her back in this clinic in 6 months or earlier if there is a problem.  Laurette Schimke, MD Allergy / Immunology [facility]

## 2023-01-02 ENCOUNTER — Encounter: Payer: Self-pay | Admitting: Allergy and Immunology

## 2023-01-29 ENCOUNTER — Ambulatory Visit: Payer: MEDICARE | Admitting: Psychiatry

## 2023-01-30 ENCOUNTER — Other Ambulatory Visit: Payer: Self-pay | Admitting: *Deleted

## 2023-01-30 MED ORDER — RYALTRIS 665-25 MCG/ACT NA SUSP: NASAL | 5 refills | Status: DC

## 2023-02-04 ENCOUNTER — Ambulatory Visit (INDEPENDENT_AMBULATORY_CARE_PROVIDER_SITE_OTHER): Payer: MEDICARE | Admitting: Psychiatry

## 2023-02-04 DIAGNOSIS — F5089 Other specified eating disorder: Secondary | ICD-10-CM | POA: Diagnosis not present

## 2023-02-04 DIAGNOSIS — F909 Attention-deficit hyperactivity disorder, unspecified type: Secondary | ICD-10-CM | POA: Diagnosis not present

## 2023-02-04 DIAGNOSIS — Z638 Other specified problems related to primary support group: Secondary | ICD-10-CM

## 2023-02-04 DIAGNOSIS — F410 Panic disorder [episodic paroxysmal anxiety] without agoraphobia: Secondary | ICD-10-CM

## 2023-02-04 DIAGNOSIS — F341 Dysthymic disorder: Secondary | ICD-10-CM | POA: Diagnosis not present

## 2023-02-04 DIAGNOSIS — F411 Generalized anxiety disorder: Secondary | ICD-10-CM

## 2023-02-04 DIAGNOSIS — M797 Fibromyalgia: Secondary | ICD-10-CM
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
## 2023-02-28 ENCOUNTER — Ambulatory Visit: Payer: MEDICARE | Admitting: Psychiatry

## 2023-04-03 ENCOUNTER — Ambulatory Visit: Payer: MEDICARE | Admitting: Psychiatry

## 2023-04-03 DIAGNOSIS — F5089 Other specified eating disorder: Secondary | ICD-10-CM | POA: Diagnosis not present

## 2023-04-03 DIAGNOSIS — F411 Generalized anxiety disorder: Secondary | ICD-10-CM | POA: Diagnosis not present

## 2023-04-03 DIAGNOSIS — R69 Illness, unspecified: Secondary | ICD-10-CM

## 2023-04-03 DIAGNOSIS — M797 Fibromyalgia: Secondary | ICD-10-CM

## 2023-04-03 DIAGNOSIS — F909 Attention-deficit hyperactivity disorder, unspecified type: Secondary | ICD-10-CM | POA: Diagnosis not present

## 2023-04-03 DIAGNOSIS — F341 Dysthymic disorder: Secondary | ICD-10-CM

## 2023-04-03 DIAGNOSIS — Z638 Other specified problems related to primary support group: Secondary | ICD-10-CM

## 2023-04-03 DIAGNOSIS — F410 Panic disorder [episodic paroxysmal anxiety] without agoraphobia: Secondary | ICD-10-CM

## 2023-04-03 NOTE — Progress Notes (Signed)
 Psychotherapy Progress Note [facility] Amy Ferry, PhD LP  Patient ID: Amy Spence)    MRN: [id number] Therapy format: Individual psychotherapy Date: 04/03/2023      Start: 2:20p     Stop: 3:05p     Time Spent: 45 min Location: In-person   Session narrative (presenting needs, interim history, self-report of stressors and symptoms, applications of prior therapy, status changes, and interventions made in session) Been battling what seems to be a sinus infection, and assessed with severe TMJ affecting trigeminal nerves, while assessing allergies.  New vent filters in the HVAC system are turning black rapidly.  Breathing and air passages have been distinctly worse since she was posted half a month at mother's house.  Right on top of it, her compulsive eating has flared, and disgusted with weight gain.  Urinary incontinence has gotten worse.  Was set for surgery to apply some kind of a gel that would help with incontinence (?).  Hard to get focus -- other concerns with grief for Amy Spence dying, and mother, and other family matters esp among difficult family in [place], where estate work is somewhat stalled with the house.  Amy Spence getting ready for knee replacement in 2 wks.  Hopeful for that, but still believes he will stay sequestered in his office reading legal briefs for his work as a Nurse, children's witness, and she is more profoundly lonely.  No longer in church, doesn't feel she can attend coughing as she does.    Support/validation provided.  Affirmed and encouraged in working out quality time, personal recovery from illness, and healthy home, and hopefully after that better courage to approach social exposure and family needs that need completing.  Therapeutic modalities: Cognitive Behavioral Therapy, Solution-Oriented/Positive Psychology, and Ego-Supportive  Mental Status/Observations:  Appearance:   Casual     Behavior:  Appropriate  Motor:  Normal   Speech/Language:   Clear and Coherent  Affect:  Appropriate  Mood:  anxious and dysthymic  Thought process:  normal and some pressure  Thought content:    Rumination  Sensory/Perceptual disturbances:    WNL  Orientation:  Fully oriented  Attention:  Good    Concentration:  Fair  Memory:  WNL  Insight:    Fair  Judgment:   Good  Impulse Control:  Variable   Risk Assessment: Danger to Self: No Self-injurious Behavior: No Danger to Others: No Physical Aggression / Violence: No Duty to Warn: No Access to Firearms a concern: No  Assessment of progress:  stabilized  Diagnosis:   ICD-10-CM   1. Generalized anxiety disorder with panic attacks  F41.1    F41.0     2. Dysthymia  F34.1     3. Attention deficit hyperactivity disorder (ADHD), unspecified ADHD type -- by hx  F90.9     4. Compulsive overeating  F50.89     5. Fibromyalgia  M79.7    along with TMJ    6. Relationship problem with family member (sister)  Z65.8     7. r/o household contamination (HVAC mold)  R69      Plan:  For boundaries and family pressures: Self-affirm OK to say her own yes or not to any time, interactions Watch own tendency to resent and release as able Try playful and inquisitive assertiveness techniques with husband's overdirection habit.  Remember, like most folks who seem to overdo control, it's probably rooted in anxiety of his own, and she can help both of them by asking  after what it is he's "afraid" of or concerned will happen if he doesn't give directions With Amy Spence, OK to check hypotheses about what things mean to him, support him getting to services.  OK to subsidize his living, be ready to us  the support as leverage to acknowledge blocks to recovery, including shame and transference reactions For food behavior and weight loss: OK with [facility] or weight clinic or another program of choice., just apply herself.  If hormonal, monitor for mood instability. Continue to practice mindful eating,  moments at least of slowing down to notice food sensations Apply self to some minimum standard of cooking for health Continue deal with Amy Spence to hold/hide tempting foods  Delay strategies for hunger, when it comes, water  first, movement first, anything else first To break down particularly tempting foods (e.g., chocolate), use imaginable disgust, e.g., figuring there are mice and rats in the factory, and the fact that you really can't know whether they got into the food, and you can't tell by looking... Practice emotional acknowledgment -- noticing and jotting down your feelings 3 times a day -- say as much or as little as you want about what's on your mind, but name the feelings.  Use the feeling words chart to pick a word you don't use so often. Develop "menu" of things can do instead of munch -- painting, Youtube, greenhouse, walk, crochet, even TV -- 15-30 minutes before making any decision to snack Still recommend daily journal of food behavior -- struggles and any partial victories, and especially affirm any time did something other than snack and it felt OK.  If logging food, be fully honest, don't leave anything out. Take own food to church dinners Carb control -- non-carbs first, reduce overall carb load, slow down and enjoy For feeling draggy, hitting the wall -- 1 tbsp MCT oil + 15 min Avoid super-low calorie targets and work for stability before trying to go lower Concur with rebalancing fat vs. protein calories Omega 3 for antiinflammatory benefit  Practice "just for today" perspective -- no big demands, do sobriety like the 12-step people do Education officer, community Anonymous for support and morale For self-esteem and mood: Practice making short work of shame -- e.g., you can have 1 minute to blame or shame, then stop and go do something that will actually improve the problem.  Remember -- the beating drives the eating. Self-affirm when taking care health or nutrition business, including  doctor visits options for stress -- get hands busy, speak her feelings, vocalize/scream, clench hands 90 seconds, various crafts (crochet, greenhouse, others) Exercise as tolerated.  Outdoor breaks, 5-10 minutes if not full walking Catch body shame/loathing and stop, don't rehearse it Medical anxiety -- Practice thinking of procedures as her opportunity to get hired help to see things she can't see for herself, are well-trained to overlook the things she considers embarrassing, and are plenty able to take on her body with the kindness, acceptance, and grace she wants and already shows other people Pain management Stretch back and legs for sciatic help May try Epsom salt baths for muscle tension relief for muscle and pain soothing, absorption of magnesium , and available aromatherapy Apply relaxation methods despite pain Probably need to update bedding Walk anyway, when pain allows; when it doesn't, stretch some Take up suggestion to go to a senior center chair yoga class Self-refer to rheumatology (e.g., Elza Handing, MD) to assess/update fibromyalgia vs. polymyalgia rheumatica vs. something else improve intake of antioxidants and antiinflammatory nutrients -- omega  3, green veg in particular, possibly biotin and/or B complex as noted for hair benefit Endorse PT and somatic treatments beyond medication Other recommendations/advice -- As may be noted above.  Continue to utilize previously learned skills ad lib. Medication compliance -- Maintain medication as prescribed and work faithfully with relevant prescriber(s) if any changes are desired or seem indicated. Crisis service -- Aware of call list and work-in appts.  Call the clinic on-call service, 988/hotline, 911, or present to [facility] or ER if any life-threatening psychiatric crisis. Followup -- Return for time as already scheduled, avail earlier @ PT's need.  Next scheduled visit with me 05/02/2023.  Next scheduled in this office  05/02/2023.  Maretta Shaper, PhD Amy Ferry, PhD LP Clinical Psychologist, [facility] [address], [address] [place], [place] [address] [phone]

## 2023-05-02 ENCOUNTER — Ambulatory Visit: Payer: MEDICARE | Admitting: Psychiatry

## 2023-05-30 ENCOUNTER — Ambulatory Visit (INDEPENDENT_AMBULATORY_CARE_PROVIDER_SITE_OTHER): Payer: MEDICARE | Admitting: Psychiatry

## 2023-05-30 DIAGNOSIS — F909 Attention-deficit hyperactivity disorder, unspecified type: Secondary | ICD-10-CM

## 2023-05-30 DIAGNOSIS — F411 Generalized anxiety disorder: Secondary | ICD-10-CM

## 2023-05-30 DIAGNOSIS — F5089 Other specified eating disorder: Secondary | ICD-10-CM

## 2023-05-30 DIAGNOSIS — M797 Fibromyalgia: Secondary | ICD-10-CM

## 2023-05-30 DIAGNOSIS — F410 Panic disorder [episodic paroxysmal anxiety] without agoraphobia: Secondary | ICD-10-CM

## 2023-05-30 DIAGNOSIS — F341 Dysthymic disorder: Secondary | ICD-10-CM | POA: Diagnosis not present
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
## 2023-07-01 ENCOUNTER — Ambulatory Visit (INDEPENDENT_AMBULATORY_CARE_PROVIDER_SITE_OTHER): Payer: MEDICARE | Admitting: Psychiatry

## 2023-07-01 DIAGNOSIS — F909 Attention-deficit hyperactivity disorder, unspecified type: Secondary | ICD-10-CM

## 2023-07-01 DIAGNOSIS — F410 Panic disorder [episodic paroxysmal anxiety] without agoraphobia: Secondary | ICD-10-CM

## 2023-07-01 DIAGNOSIS — M797 Fibromyalgia: Secondary | ICD-10-CM

## 2023-07-01 DIAGNOSIS — F5089 Other specified eating disorder: Secondary | ICD-10-CM | POA: Diagnosis not present

## 2023-07-01 DIAGNOSIS — Z638 Other specified problems related to primary support group: Secondary | ICD-10-CM

## 2023-07-01 DIAGNOSIS — F411 Generalized anxiety disorder: Secondary | ICD-10-CM | POA: Diagnosis not present

## 2023-07-01 DIAGNOSIS — F341 Dysthymic disorder: Secondary | ICD-10-CM
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
## 2023-07-02 ENCOUNTER — Encounter: Payer: Self-pay | Admitting: Allergy and Immunology

## 2023-07-02 ENCOUNTER — Ambulatory Visit (INDEPENDENT_AMBULATORY_CARE_PROVIDER_SITE_OTHER): Payer: MEDICARE | Admitting: Allergy and Immunology

## 2023-07-02 ENCOUNTER — Other Ambulatory Visit: Payer: Self-pay | Admitting: *Deleted

## 2023-07-02 VITALS — BP 110/70 | HR 88 | Resp 16

## 2023-07-02 DIAGNOSIS — H04123 Dry eye syndrome of bilateral lacrimal glands: Secondary | ICD-10-CM

## 2023-07-02 DIAGNOSIS — J3089 Other allergic rhinitis: Secondary | ICD-10-CM

## 2023-07-02 DIAGNOSIS — K219 Gastro-esophageal reflux disease without esophagitis: Secondary | ICD-10-CM | POA: Diagnosis not present

## 2023-07-02 DIAGNOSIS — R519 Headache, unspecified: Secondary | ICD-10-CM

## 2023-07-02 MED ORDER — CYPROHEPTADINE HCL 4 MG PO TABS: ORAL_TABLET | ORAL | 1 refills | Status: DC

## 2023-07-02 NOTE — Progress Notes (Unsigned)
 [place] - [place] - [place] - [place]   Follow-up Note  Referring Provider: Madlyn Schirmer, MD Primary Provider: Madlyn Schirmer, MD Date of Office Visit: 07/02/2023  Subjective:   Amy Spence (DOB: 03-21-1955) is a 68 y.o. female who [facility] in re-evaluation of the following:  HPI: Kaliyah returns to this clinic in evaluation of rhinitis, chronic daily headache, LPR, dry eye.  I last saw her in last clinic 01 January 2023.  For some reason, which is not entirely clear either to me or Bianney, she stopped her Periactin  at some point in time and now she has daily headache and very bad sleep dysfunction.  She thought that her headache might have been sinus disease and you she went to see an ENT doctor who performed a CT scan of her sinuses which identified normal sinus cavities, which was also seen in her MRI of 2023.  She still occasionally gets stuffy in her nose and she does not think that the combination nasal steroid nasal antihistamine helped her very much.  During previous evaluation she was deemed not to be allergic based upon skin test results and a IgE aero allergen profile.  Her reflux and her throat are doing great while addressing issues tied up with LPR with the use of a proton pump inhibitor twice a day.  Allergies as of 07/02/2023       Reactions   Nitrofurantoin Itching   Latex Rash   Minor irriation        Medication List    atorvastatin  20 MG tablet Commonly known as: LIPITOR Take 20 mg by mouth daily.   B-12 5000 MCG Subl Place 5,000 mcg under the tongue daily.   buPROPion  150 MG 24 hr tablet Commonly known as: WELLBUTRIN  XL Take 450 mg by mouth daily.   cyproheptadine  4 MG tablet Commonly known as: PERIACTIN  1/2 -1 tablet at bedtime   docusate sodium  100 MG capsule Commonly known as: COLACE Take 100 mg by mouth every 8 (eight) hours as needed for mild constipation.    estradiol 0.1 MG/GM vaginal cream Commonly known as: ESTRACE   gabapentin  400 MG capsule Commonly known as: NEURONTIN  Take 400 mg by mouth 3 (three) times daily.   gabapentin  100 MG capsule Commonly known as: NEURONTIN    glycerin adult 2 g suppository Place 1 suppository rectally daily as needed for constipation.   HAIR SKIN & NAILS PO Take by mouth daily.   hydrOXYzine  10 MG tablet Commonly known as: ATARAX  Take 10 mg by mouth 3 (three) times daily as needed for anxiety.   lidocaine  5 % Commonly known as: LIDODERM  Apply patch to painful area. Patch may remain in place for up to 12 hours in a 24 hour period.   lisdexamfetamine 50 MG capsule Commonly known as: VYVANSE  Take by mouth.   meloxicam 15 MG tablet Commonly known as: MOBIC Take 15 mg by mouth daily.   mirabegron ER 50 MG Tb24 tablet Commonly known as: MYRBETRIQ Take 50 mg by mouth daily.   MULTIVITAMIN PO Take by mouth daily.   omeprazole  40 MG capsule Commonly known as: PRILOSEC Take 1 capsule (40 mg total) by mouth 2 (two) times daily.   Ryaltris  665-25 MCG/ACT Susp Generic drug: Olopatadine-Mometasone 2 sprays each nostril 1-2 times per day   sertraline  100 MG tablet Commonly known as: ZOLOFT  Take 200 mg by mouth daily.   Vitamin D 50 MCG (2000 UT)  tablet Take 2,000 Units by mouth daily.   Xiidra 5 % Soln Generic drug: Lifitegrast Apply to eye.    Past Medical History:  Diagnosis Date   Allergic rhinitis    Anemia    Anxiety    Arthritis    DDD (degenerative disc disease), lumbar    Depression    Erosive gastritis    Fibromyalgia    GERD (gastroesophageal reflux disease)    Pneumonia     Past Surgical History:  Procedure Laterality Date   BREAST SURGERY     augmentation   EYE SURGERY     eye lid   KNEE ARTHROSCOPY     REVERSE SHOULDER ARTHROPLASTY Right 12/03/2018   Procedure: REVERSE SHOULDER ARTHROPLASTY;  Surgeon: Ellard Gunning, MD;  Location: [facility];  Service:  Orthopedics;  Laterality: Right;    TONSILLECTOMY     TOTAL KNEE ARTHROPLASTY Right 05/16/2020   Procedure: TOTAL KNEE ARTHROPLASTY;  Surgeon: Claiborne Crew, MD;  Location: [facility];  Service: Orthopedics;  Laterality: Right;  70 mins   TUBAL LIGATION      Review of systems negative except as noted in HPI / PMHx or noted below:  Review of Systems  Constitutional: Negative.   HENT: Negative.    Eyes: Negative.   Respiratory: Negative.    Cardiovascular: Negative.   Gastrointestinal: Negative.   Genitourinary: Negative.   Musculoskeletal: Negative.   Skin: Negative.   Neurological: Negative.   Endo/Heme/Allergies: Negative.   Psychiatric/Behavioral: Negative.       Objective:   Vitals:   07/02/23 1617  BP: 110/70  Pulse: 88  Resp: 16  SpO2: 93%          Physical Exam Constitutional:      Appearance: She is not diaphoretic.  HENT:     Head: Normocephalic.     Right Ear: Tympanic membrane, ear canal and external ear normal.     Left Ear: Tympanic membrane, ear canal and external ear normal.     Nose: Nose normal. No mucosal edema or rhinorrhea.     Mouth/Throat:     Pharynx: Uvula midline. No oropharyngeal exudate.  Eyes:     Conjunctiva/sclera: Conjunctivae normal.  Neck:     Thyroid : No thyromegaly.     Trachea: Trachea normal. No tracheal tenderness or tracheal deviation.  Cardiovascular:     Rate and Rhythm: Normal rate and regular rhythm.     Heart sounds: Normal heart sounds, S1 normal and S2 normal. No murmur heard. Pulmonary:     Effort: No respiratory distress.     Breath sounds: Normal breath sounds. No stridor. No wheezing or rales.  Lymphadenopathy:     Head:     Right side of head: No tonsillar adenopathy.     Left side of head: No tonsillar adenopathy.     Cervical: No cervical adenopathy.  Skin:    Findings: No erythema or rash.     Nails: There is no clubbing.  Neurological:     Mental Status: She is alert.      Diagnostics:None  Assessment and Plan:   1. Perennial allergic rhinitis   2. Chronic daily headache   3. Dry eye syndrome of both eyes   4. LPRD (laryngopharyngeal reflux disease)    1. Treat and prevent reflux induced airway inflammation:   A. Omeprazole  40 mg - 1 tablet 1-2 times per day  B. Minimize caffeine and chocolate consumption  C. Replace throat clearing with swallowing / drinking maneuver  2.  Treat and prevent chronic daily headache / face pressure:  A. Decrease caffeine and chocolate consumption B. Periactin  4 mg -1 tablet at bedtime  3. If needed:   A. Systane eye drops multiple times per day  B. Pataday - 1 drop each eye 1 time per day  C. Nasal saline multiple times per day  4. Return to clinic in 4 weeks or earlier if problem  5. Influenza = Tamiflu. Covid = paxlovid  Benard Brackett, MD Allergy  / Immunology [facility]

## 2023-07-02 NOTE — Patient Instructions (Addendum)
  1. Treat and prevent reflux induced airway inflammation:   A. Omeprazole  40 mg - 1 tablet 1-2 times per day  B. Minimize caffeine and chocolate consumption  C. Replace throat clearing with swallowing / drinking maneuver  2. Treat and prevent chronic daily headache / face pressure:  A. Decrease caffeine and chocolate consumption B. Periactin  4 mg -1 tablet at bedtime  3. If needed:   A. Systane eye drops multiple times per day  B. Pataday - 1 drop each eye 1 time per day  C. Nasal saline multiple times per day  4. Return to clinic in 4 weeks or earlier if problem  5. Influenza = Tamiflu. Covid = paxlovid

## 2023-07-03 ENCOUNTER — Encounter: Payer: Self-pay | Admitting: Allergy and Immunology

## 2023-07-23 ENCOUNTER — Other Ambulatory Visit: Payer: Self-pay | Admitting: Allergy and Immunology

## 2023-07-24 ENCOUNTER — Encounter: Payer: Self-pay | Admitting: Allergy and Immunology

## 2023-07-24 ENCOUNTER — Ambulatory Visit (INDEPENDENT_AMBULATORY_CARE_PROVIDER_SITE_OTHER): Payer: MEDICARE | Admitting: Allergy and Immunology

## 2023-07-24 VITALS — BP 110/78 | HR 85 | Resp 16

## 2023-07-24 DIAGNOSIS — R519 Headache, unspecified: Secondary | ICD-10-CM

## 2023-07-24 DIAGNOSIS — H04123 Dry eye syndrome of bilateral lacrimal glands: Secondary | ICD-10-CM

## 2023-07-24 DIAGNOSIS — K219 Gastro-esophageal reflux disease without esophagitis: Secondary | ICD-10-CM | POA: Diagnosis not present

## 2023-07-24 DIAGNOSIS — G4720 Circadian rhythm sleep disorder, unspecified type: Secondary | ICD-10-CM | POA: Diagnosis not present

## 2023-07-24 DIAGNOSIS — J3089 Other allergic rhinitis: Secondary | ICD-10-CM

## 2023-07-24 MED ORDER — CYPROHEPTADINE HCL 4 MG PO TABS: ORAL_TABLET | ORAL | 1 refills | Status: DC

## 2023-07-24 NOTE — Patient Instructions (Signed)
  1. Treat and prevent reflux induced airway inflammation:   A. Omeprazole  40 mg - 1 tablet 1-2 times per day  B. Minimize caffeine and chocolate consumption  C. Replace throat clearing with swallowing / drinking maneuver  2. Treat and prevent chronic daily headache / face pressure:  A. Decrease caffeine and chocolate consumption B. Periactin  4 mg - 1 - 1&1/2 - 2 tablets at bedtime. Find a dose that works  3. If needed:   A. Systane eye drops multiple times per day  B. Pataday - 1 drop each eye 1 time per day  C. Nasal saline multiple times per day  4. Return to clinic in 12 weeks or earlier if problem  5. Influenza = Tamiflu. Covid = paxlovid

## 2023-07-24 NOTE — Progress Notes (Signed)
 [place] - [place] - [place] - [place] - [place]   Follow-up Note  Referring Provider: Madlyn Schirmer, MD Primary Provider: Madlyn Schirmer, MD Date of Office Visit: 07/24/2023  Subjective:   Amy Spence (DOB: 1955/09/12) is a 68 y.o. female who [facility] in re-evaluation of the following:  HPI: [name] returns to this clinic in evaluation of chronic rhinitis, LPR, dry eye syndrome, chronic daily headache.  I last saw her in this clinic 02 July 2023.  She is doing very well with her airway at this point in time.  While using Periactin  at 2 mg every night she has not had any headaches but she has fractured sleep.  Her reflux in her throat is doing well and a lot of her postnasal drip has resolved.  Allergies as of 07/24/2023       Reactions   Nitrofurantoin Itching   Latex Rash   Minor irriation        Medication List    atorvastatin  20 MG tablet Commonly known as: LIPITOR Take 20 mg by mouth daily.   B-12 5000 MCG Subl Place 5,000 mcg under the tongue daily.   buPROPion  150 MG 24 hr tablet Commonly known as: WELLBUTRIN  XL Take 450 mg by mouth daily.   cyproheptadine  4 MG tablet Commonly known as: PERIACTIN  Take 1/2 -2 tablet at bedtime   docusate sodium  100 MG capsule Commonly known as: COLACE Take 100 mg by mouth every 8 (eight) hours as needed for mild constipation.   estradiol 0.1 MG/GM vaginal cream Commonly known as: ESTRACE   gabapentin  400 MG capsule Commonly known as: NEURONTIN  Take 400 mg by mouth 3 (three) times daily.   gabapentin  100 MG capsule Commonly known as: NEURONTIN    glycerin adult 2 g suppository Place 1 suppository rectally daily as needed for constipation.   HAIR SKIN & NAILS PO Take by mouth daily.   hydrOXYzine  10 MG tablet Commonly known as: ATARAX  Take 10 mg by mouth 3 (three) times daily as needed for anxiety.   lidocaine  5 % Commonly known as:  LIDODERM  Apply patch to painful area. Patch may remain in place for up to 12 hours in a 24 hour period.   lisdexamfetamine 50 MG capsule Commonly known as: VYVANSE  Take by mouth.   meloxicam 15 MG tablet Commonly known as: MOBIC Take 15 mg by mouth daily.   mirabegron ER 50 MG Tb24 tablet Commonly known as: MYRBETRIQ Take 50 mg by mouth daily.   MULTIVITAMIN PO Take by mouth daily.   omeprazole  40 MG capsule Commonly known as: PRILOSEC TAKE ONE CAPSULE BY MOUTH TWICE DAILY   Ryaltris  665-25 MCG/ACT Susp Generic drug: Olopatadine-Mometasone 2 sprays each nostril 1-2 times per day   sertraline  100 MG tablet Commonly known as: ZOLOFT  Take 200 mg by mouth daily.   Vitamin D 50 MCG (2000 UT) tablet Take 2,000 Units by mouth daily.   Xiidra 5 % Soln Generic drug: Lifitegrast Apply to eye.    Past Medical History:  Diagnosis Date   Allergic rhinitis    Anemia    Anxiety    Arthritis    DDD (degenerative disc disease), lumbar    Depression    Erosive gastritis    Fibromyalgia    GERD (gastroesophageal reflux disease)    Pneumonia     Past Surgical History:  Procedure Laterality Date   BREAST SURGERY     augmentation   EYE SURGERY  eye lid   KNEE ARTHROSCOPY     REVERSE SHOULDER ARTHROPLASTY Right 12/03/2018   Procedure: REVERSE SHOULDER ARTHROPLASTY;  Surgeon: Ellard Gunning, MD;  Location: [facility];  Service: Orthopedics;  Laterality: Right;    TONSILLECTOMY     TOTAL KNEE ARTHROPLASTY Right 05/16/2020   Procedure: TOTAL KNEE ARTHROPLASTY;  Surgeon: Claiborne Crew, MD;  Location: [facility];  Service: Orthopedics;  Laterality: Right;  70 mins   TUBAL LIGATION      Review of systems negative except as noted in HPI / PMHx or noted below:  Review of Systems  Constitutional: Negative.   HENT: Negative.    Eyes: Negative.   Respiratory: Negative.    Cardiovascular: Negative.   Gastrointestinal: Negative.   Genitourinary: Negative.   Musculoskeletal:  Negative.   Skin: Negative.   Neurological: Negative.   Endo/Heme/Allergies: Negative.   Psychiatric/Behavioral: Negative.       Objective:   Vitals:   07/24/23 1522  BP: 110/78  Pulse: 85  Resp: 16  SpO2: 95%          Physical Exam Constitutional:      Appearance: She is not diaphoretic.  HENT:     Head: Normocephalic.     Right Ear: Tympanic membrane, ear canal and external ear normal.     Left Ear: Tympanic membrane, ear canal and external ear normal.     Nose: Nose normal. No mucosal edema or rhinorrhea.     Mouth/Throat:     Pharynx: Uvula midline. No oropharyngeal exudate.  Eyes:     Conjunctiva/sclera: Conjunctivae normal.  Neck:     Thyroid : No thyromegaly.     Trachea: Trachea normal. No tracheal tenderness or tracheal deviation.  Cardiovascular:     Rate and Rhythm: Normal rate and regular rhythm.     Heart sounds: Normal heart sounds, S1 normal and S2 normal. No murmur heard. Pulmonary:     Effort: No respiratory distress.     Breath sounds: Normal breath sounds. No stridor. No wheezing or rales.  Lymphadenopathy:     Head:     Right side of head: No tonsillar adenopathy.     Left side of head: No tonsillar adenopathy.     Cervical: No cervical adenopathy.  Skin:    Findings: No erythema or rash.     Nails: There is no clubbing.  Neurological:     Mental Status: She is alert.     Diagnostics: none  Assessment and Plan:   1. Perennial allergic rhinitis   2. Chronic daily headache   3. Dysfunction of sleep stage or arousal   4. LPRD (laryngopharyngeal reflux disease)   5. Dry eye syndrome of both eyes    1. Treat and prevent reflux induced airway inflammation:   A. Omeprazole  40 mg - 1 tablet 1-2 times per day  B. Minimize caffeine and chocolate consumption  C. Replace throat clearing with swallowing / drinking maneuver  2. Treat and prevent chronic daily headache / face pressure:  A. Decrease caffeine and chocolate consumption B.  Periactin  4 mg - 1 - 1&1/2 - 2 tablets at bedtime. Find a dose that works  3. If needed:   A. Systane eye drops multiple times per day  B. Pataday - 1 drop each eye 1 time per day  C. Nasal saline multiple times per day  4. Return to clinic in 12 weeks or earlier if problem  5. Influenza = Tamiflu. Covid = paxlovid  I will have Trenia Fritter increase  her Periactin  to 4 mg at night and then if that does not help her sleep dysfunction she can try 6 mg and subsequently 8 mg.  Certainly Periactin  has helped with her chronic daily headache.  She will continue to treat LPR as noted above and she will continue on other medications for her airway and eyes as noted above and we will see her back in this clinic in 12 weeks or earlier if there is a problem.  Schuyler Custard, MD Allergy  / Immunology [facility]

## 2023-07-28 ENCOUNTER — Encounter: Payer: Self-pay | Admitting: Allergy and Immunology

## 2023-07-31 ENCOUNTER — Ambulatory Visit (INDEPENDENT_AMBULATORY_CARE_PROVIDER_SITE_OTHER): Payer: MEDICARE | Admitting: Psychiatry

## 2023-07-31 DIAGNOSIS — M797 Fibromyalgia: Secondary | ICD-10-CM

## 2023-07-31 DIAGNOSIS — Z638 Other specified problems related to primary support group: Secondary | ICD-10-CM

## 2023-07-31 DIAGNOSIS — F909 Attention-deficit hyperactivity disorder, unspecified type: Secondary | ICD-10-CM | POA: Diagnosis not present

## 2023-07-31 DIAGNOSIS — F341 Dysthymic disorder: Secondary | ICD-10-CM

## 2023-07-31 DIAGNOSIS — F411 Generalized anxiety disorder: Secondary | ICD-10-CM | POA: Diagnosis not present

## 2023-07-31 DIAGNOSIS — F5089 Other specified eating disorder: Secondary | ICD-10-CM | POA: Diagnosis not present

## 2023-07-31 DIAGNOSIS — F410 Panic disorder [episodic paroxysmal anxiety] without agoraphobia: Secondary | ICD-10-CM
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
## 2023-09-01 ENCOUNTER — Ambulatory Visit: Payer: MEDICARE | Admitting: Psychiatry

## 2023-09-22 ENCOUNTER — Other Ambulatory Visit: Payer: Self-pay | Admitting: Allergy and Immunology

## 2023-09-29 ENCOUNTER — Ambulatory Visit (INDEPENDENT_AMBULATORY_CARE_PROVIDER_SITE_OTHER): Payer: MEDICARE | Admitting: Psychiatry

## 2023-09-29 DIAGNOSIS — F410 Panic disorder [episodic paroxysmal anxiety] without agoraphobia: Secondary | ICD-10-CM

## 2023-09-29 DIAGNOSIS — M797 Fibromyalgia: Secondary | ICD-10-CM

## 2023-09-29 DIAGNOSIS — F341 Dysthymic disorder: Secondary | ICD-10-CM | POA: Diagnosis not present

## 2023-09-29 DIAGNOSIS — F5089 Other specified eating disorder: Secondary | ICD-10-CM

## 2023-09-29 DIAGNOSIS — F909 Attention-deficit hyperactivity disorder, unspecified type: Secondary | ICD-10-CM

## 2023-09-29 DIAGNOSIS — F411 Generalized anxiety disorder: Secondary | ICD-10-CM

## 2023-09-29 DIAGNOSIS — G479 Sleep disorder, unspecified: Secondary | ICD-10-CM

## 2023-09-29 DIAGNOSIS — R69 Illness, unspecified: Secondary | ICD-10-CM

## 2023-09-29 NOTE — Progress Notes (Signed)
 Psychotherapy Progress Note [facility] Amy Kendall, PhD LP  Patient ID: Amy Spence)    MRN: [id number] Therapy format: Individual psychotherapy Date: 09/29/2023      Start: 2:14p     Stop: 3:01p     Time Spent: 47 min Location: In-person   Session narrative (presenting needs, interim history, self-report of stressors and symptoms, applications of prior therapy, status changes, and interventions made in session) Frustrated with regaining weight, knows she got triggered back to emotional eating during the house issue, then back to habit of snacking.  Advised again about insulin  resistance and carb control.  Is dry needling and back to PT, plus massage, hoping to work out more of her chronic pain from fibro and lumbar DDD.  Endorsed magnesium , and reducing melatonin (on 20mg , apparently on doctor's suggestion).  Rejoined [employer], trying to go for a 1200 calorie diet.  Advised on portion control, compulsive plate-cleaning, and 15-min delay for impulse eating.  Affirmed and encouraged in taking steps available and rebuilding previously known discipline.  Therapeutic modalities: Cognitive Behavioral Therapy, Solution-Oriented/Positive Psychology, and Ego-Supportive  Mental Status/Observations:  Appearance:   Casual     Behavior:  Appropriate  Motor:  Normal  Speech/Language:   Clear and Coherent  Affect:  Appropriate  Mood:  discouraged, responsive  Thought process:  normal  Thought content:    WNL  Sensory/Perceptual disturbances:    WNL  Orientation:  Fully oriented  Attention:  Good    Concentration:  Good  Memory:  WNL  Insight:    Fair  Judgment:   Good  Impulse Control:  Fair   Risk Assessment: Danger to Self: No Self-injurious Behavior: No Danger to Others: No Physical Aggression / Violence: No Duty to Warn: No Access to Firearms a concern: No  Assessment of progress:  stabilized  Diagnosis:   ICD-10-CM   1. Generalized anxiety  disorder with panic attacks  F41.1    F41.0     2. Dysthymia  F34.1     3. Attention deficit hyperactivity disorder (ADHD), unspecified ADHD type -- by hx  F90.9     4. Compulsive overeating  F50.89     5. Fibromyalgia  M79.7     6. Difficulty sleeping  G47.9     7. r/o household contamination (HVAC mold)  R69      Plan:  For boundaries and family pressures: Self-affirm OK to say her own yes or not to any time, interactions Watch own tendency to resent and release as able Try playful and inquisitive assertiveness techniques with husband's overdirection habit.  Remember, like most folks who seem to overdo control, it's probably rooted in anxiety of his own, and she can help both of them by asking after what it is he's afraid of or concerned will happen if he doesn't give directions With [name], [place] to check hypotheses about what things mean to him, support him getting to services.  OK to subsidize his living, be ready to us  the support as leverage to acknowledge blocks to recovery, including shame and transference reactions For food behavior and weight loss: OK with GOLO or weight clinic or another program of choice., just apply herself.  If hormonal, monitor for mood instability. Continue to practice mindful eating, moments at least of slowing down to notice food sensations Apply self to some minimum standard of cooking for health Continue deal with Amy to hold/hide tempting foods  Delay strategies for hunger, when it comes, water  first, movement first,  anything else first To break down particularly tempting foods (e.g., chocolate), use imaginable disgust, e.g., figuring there are mice and rats in the factory, and the fact that you really can't know whether they got into the food, and you can't tell by looking... Practice emotional acknowledgment -- noticing and jotting down your feelings 3 times a day -- say as much or as little as you want about what's on your mind, but name the  feelings.  Use the feeling words chart to pick a word you don't use so often. Develop menu of things can do instead of munch -- painting, Youtube, greenhouse, walk, crochet, even TV -- 15-30 minutes before making any decision to snack Still recommend daily journal of food behavior -- struggles and any partial victories, and especially affirm any time did something other than snack and it felt OK.  If logging food, be fully honest, don't leave anything out. Take own food to church dinners Carb control -- non-carbs first, reduce overall carb load, slow down and enjoy For feeling draggy, hitting the wall -- 1 tbsp MCT oil + 15 min Avoid super-low calorie targets and work for stability before trying to go lower Concur with rebalancing fat vs. protein calories Omega 3 for antiinflammatory benefit  Practice just for today perspective -- no big demands, do sobriety like the 12-step people do Education officer, community Anonymous for support and morale For self-esteem and mood: Practice making short work of shame -- e.g., you can have 1 minute to blame or shame, then stop and go do something that will actually improve the problem.  Remember -- the beating drives the eating. Self-affirm when taking care health or nutrition business, including doctor visits options for stress -- get hands busy, speak her feelings, vocalize/scream, clench hands 90 seconds, various crafts (crochet, greenhouse, others) Exercise as tolerated.  Outdoor breaks, 5-10 minutes if not full walking Catch body shame/loathing and stop, don't rehearse it Medical anxiety -- Practice thinking of procedures as her opportunity to get hired help to see things she can't see for herself, are well-trained to overlook the things she considers embarrassing, and are plenty able to take on her body with the kindness, acceptance, and grace she wants and already shows other people Pain management Stretch back and legs for sciatic help May try Epsom salt  baths for muscle tension relief for muscle and pain soothing, absorption of magnesium , and available aromatherapy Apply relaxation methods despite pain Probably need to update bedding Walk anyway, when pain allows; when it doesn't, stretch some Take up suggestion to go to a senior center chair yoga class Self-refer to rheumatology (e.g., Maya Cockayne, MD) to assess/update fibromyalgia vs. polymyalgia rheumatica vs. something else improve intake of antioxidants and antiinflammatory nutrition -- omega 3, green veg in particular, possibly biotin and/or B complex as noted for hair benefit Endorse PT and somatic treatments beyond medication Other recommendations/advice -- As may be noted above.  Continue to utilize previously learned skills ad lib. Medication compliance -- Maintain medication as prescribed and work faithfully with relevant prescriber(s) if any changes are desired or seem indicated. Crisis service -- Aware of call list and work-in appts.  Call the clinic on-call service, 988/hotline, 911, or present to [facility] or ER if any life-threatening psychiatric crisis. Followup -- Return for time as already scheduled, avail earlier @ PT's need.  Next scheduled visit with me 10/30/2023.  Next scheduled in this office 10/30/2023.  Amy Kendall, PhD Amy Kendall, PhD LP Clinical Psychologist, [facility]  Group [facility] [address], [address] [place], [place] [address] [phone]

## 2023-10-16 ENCOUNTER — Ambulatory Visit: Payer: MEDICARE | Admitting: Allergy and Immunology

## 2023-10-30 ENCOUNTER — Ambulatory Visit: Payer: MEDICARE | Admitting: Psychiatry

## 2023-11-06 ENCOUNTER — Ambulatory Visit: Payer: MEDICARE | Admitting: Allergy and Immunology

## 2023-11-20 ENCOUNTER — Ambulatory Visit (INDEPENDENT_AMBULATORY_CARE_PROVIDER_SITE_OTHER): Payer: MEDICARE | Admitting: Psychiatry

## 2023-11-20 DIAGNOSIS — F341 Dysthymic disorder: Secondary | ICD-10-CM | POA: Diagnosis not present

## 2023-11-20 DIAGNOSIS — F5089 Other specified eating disorder: Secondary | ICD-10-CM | POA: Diagnosis not present

## 2023-11-20 DIAGNOSIS — Z636 Dependent relative needing care at home: Secondary | ICD-10-CM

## 2023-11-20 DIAGNOSIS — F411 Generalized anxiety disorder: Secondary | ICD-10-CM | POA: Diagnosis not present

## 2023-11-20 DIAGNOSIS — M797 Fibromyalgia: Secondary | ICD-10-CM

## 2023-11-20 DIAGNOSIS — G479 Sleep disorder, unspecified: Secondary | ICD-10-CM

## 2023-11-20 DIAGNOSIS — M25551 Pain in right hip: Secondary | ICD-10-CM

## 2023-11-20 DIAGNOSIS — J189 Pneumonia, unspecified organism: Secondary | ICD-10-CM

## 2023-11-20 DIAGNOSIS — F410 Panic disorder [episodic paroxysmal anxiety] without agoraphobia: Secondary | ICD-10-CM

## 2023-11-20 DIAGNOSIS — F909 Attention-deficit hyperactivity disorder, unspecified type: Secondary | ICD-10-CM

## 2023-11-20 DIAGNOSIS — Z638 Other specified problems related to primary support group: Secondary | ICD-10-CM

## 2023-11-20 NOTE — Progress Notes (Signed)
 Psychotherapy Progress Note [facility] Jodie Kendall, PhD LP  Patient ID: Amy Spence)    MRN: [id number] Therapy format: Individual psychotherapy Date: 11/20/2023      Start: 4:31p     Stop: 5:03p     Time Spent: 32 min Location: In-person   Session narrative (presenting needs, interim history, self-report of stressors and symptoms, applications of prior therapy, status changes, and interventions made in session) Error today trying to come at 4:45 (misreading the reminder text and saw our address as the time), apologetic for coming late.  Says she is being treated for walking pneumonia right now, which might have contributed.    Pain management dropped her to Mobic 2/wk out of concern for her kidneys, precipitating much worse pain.  Ortho PA noticed and Rx'd Celebrex , hopeful of improvement.  In PT, but paused for pneumonia.  Seeing one of the most highly rated PTs in the area, Tom O'Halloran.    Not going to church in this time, feels guilty.  Assured that making a fatigue and pain decision is plenty reason enough, not at all out of the norm, and certainly not out of God's understanding.  Validated that pain and sleep loss are good enough reasons, never mind what her mind says.  Meanwhile, [name] -- who has pushed to get the house sold in [place] -- is responsible for handing over the keys and decidedly dragging her feet getting the last of her things moved out.  Hypotheses why, doesn't seem like passive aggression, more likely resisting the finality of turning over mother's home.  KATHEE Anes, who needs the proceeds, has offered help, not taken up on it.  Encouraged in being willing to ask [name], supportively, what's up, and if she doesn't know, to check the grief-based resistance hypothesis and be ready to validate the feeling while challenging her to go and act.  Therapeutic modalities: Cognitive Behavioral Therapy and Solution-Oriented/Positive Psychology  Mental  Status/Observations:  Appearance:   Casual     Behavior:  Appropriate  Motor:  Normal  Speech/Language:   Clear and Coherent  Affect:  Appropriate  Mood:  dysthymic  Thought process:  normal  Thought content:    worry  Sensory/Perceptual disturbances:    WNL  Orientation:  Fully oriented  Attention:  Good    Concentration:  Fair  Memory:  WNL  Insight:    Variable  Judgment:   Good  Impulse Control:  Good   Risk Assessment: Danger to Self: No Self-injurious Behavior: No Danger to Others: No Physical Aggression / Violence: No Duty to Warn: No Access to Firearms a concern: No  Assessment of progress:  progressing  Diagnosis:   ICD-10-CM   1. Generalized anxiety disorder with panic attacks  F41.1    F41.0     2. Dysthymia  F34.1     3. Attention deficit hyperactivity disorder (ADHD), unspecified ADHD type -- by hx  F90.9     4. Compulsive overeating  F50.89     5. Difficulty sleeping  G47.9     6. Caregiver stress  Z63.6     7. Relationship problem with family member (sister)  Z64.8     8. Fibromyalgia  M79.7     9. Pain of right hip  M25.551     10. Walking pneumonia  J18.9      Plan:  For boundaries and family pressures: Self-affirm OK to say her own yes or not to any time, interactions Watch own tendency  to resent and release as able Try playful and inquisitive assertiveness techniques with husband's overdirection habit.  Remember, like most folks who seem to overdo control, it's probably rooted in anxiety of his own, and she can help both of them by asking after what it is he's afraid of or concerned will happen if he doesn't give directions With [name], OK to check hypotheses about what things mean to him, support him getting to services.  OK to subsidize his living, be ready to us  the support as leverage to acknowledge blocks to recovery, including shame and transference reactions For food behavior and weight loss: OK with [facility] or weight clinic or another  program of choice., just apply herself.  If hormonal, monitor for mood instability. Continue to practice mindful eating, moments at least of slowing down to notice food sensations Apply self to some minimum standard of cooking for health Continue deal with Jodie to hold/hide tempting foods  Delay strategies for hunger, when it comes, water  first, movement first, anything else first To break down particularly tempting foods (e.g., chocolate), use imaginable disgust, e.g., figuring there are mice and rats in the factory, and the fact that you really can't know whether they got into the food, and you can't tell by looking... Practice emotional acknowledgment -- noticing and jotting down your feelings 3 times a day -- say as much or as little as you want about what's on your mind, but name the feelings.  Use the feeling words chart to pick a word you don't use so often. Develop menu of things can do instead of munch -- painting, Youtube, greenhouse, walk, crochet, even TV -- 15-30 minutes before making any decision to snack Still recommend daily journal of food behavior -- struggles and any partial victories, and especially affirm any time did something other than snack and it felt OK.  If logging food, be fully honest, don't leave anything out. Take own food to church dinners Carb control -- non-carbs first, reduce overall carb load, slow down and enjoy For feeling draggy, hitting the wall -- 1 tbsp MCT oil + 15 min Avoid super-low calorie targets and work for stability before trying to go lower Concur with rebalancing fat vs. protein calories Omega 3 for antiinflammatory benefit  Practice just for today perspective -- no big demands, do sobriety like the 12-step people do Education officer, community Anonymous for support and morale For self-esteem and mood: Practice making short work of shame -- e.g., you can have 1 minute to blame or shame, then stop and go do something that will actually improve the  problem.  Remember -- the beating drives the eating. Self-affirm when taking care health or nutrition business, including doctor visits options for stress -- get hands busy, speak her feelings, vocalize/scream, clench hands 90 seconds, various crafts (crochet, greenhouse, others) Exercise as tolerated.  Outdoor breaks, 5-10 minutes if not full walking Catch body shame/loathing and stop, don't rehearse it Medical anxiety -- Practice thinking of procedures as her opportunity to get hired help to see things she can't see for herself, are well-trained to overlook the things she considers embarrassing, and are plenty able to take on her body with the kindness, acceptance, and grace she wants and already shows other people Pain management Stretch back and legs for sciatic help May try Epsom salt baths for muscle tension relief for muscle and pain soothing, absorption of magnesium , and available aromatherapy Apply relaxation methods despite pain Probably need to update bedding Walk anyway, when pain  allows; when it doesn't, stretch some Take up suggestion to go to a senior center chair yoga class Self-refer to rheumatology (e.g., Maya Cockayne, MD) to assess/update fibromyalgia vs. polymyalgia rheumatica vs. something else improve intake of antioxidants and antiinflammatory nutrition -- omega 3, green veg in particular, possibly biotin and/or B complex as noted for hair benefit Endorse PT and somatic treatments beyond medication Other recommendations/advice -- As may be noted above.  Continue to utilize previously learned skills ad lib. Medication compliance -- Maintain medication as prescribed and work faithfully with relevant prescriber(s) if any changes are desired or seem indicated. Crisis service -- Aware of call list and work-in appts.  Call the clinic on-call service, 988/hotline, 911, or present to [facility] or ER if any life-threatening psychiatric crisis. Followup -- Return for time as already  scheduled.  Next scheduled visit with me 12/31/2023.  Next scheduled in this office 12/31/2023.  Lamar Kendall, PhD Jodie Kendall, PhD LP Clinical Psychologist, [facility] [address], [address] [place], [place] [address] [phone]

## 2023-11-22 ENCOUNTER — Other Ambulatory Visit: Payer: Self-pay | Admitting: Allergy and Immunology

## 2023-12-02 ENCOUNTER — Ambulatory Visit: Payer: MEDICARE | Admitting: Psychiatry

## 2023-12-31 ENCOUNTER — Ambulatory Visit: Payer: MEDICARE | Admitting: Psychiatry

## 2023-12-31 DIAGNOSIS — Z658 Other specified problems related to psychosocial circumstances: Secondary | ICD-10-CM

## 2023-12-31 DIAGNOSIS — F410 Panic disorder [episodic paroxysmal anxiety] without agoraphobia: Secondary | ICD-10-CM

## 2023-12-31 DIAGNOSIS — F411 Generalized anxiety disorder: Secondary | ICD-10-CM

## 2023-12-31 DIAGNOSIS — M797 Fibromyalgia: Secondary | ICD-10-CM

## 2023-12-31 DIAGNOSIS — M25551 Pain in right hip: Secondary | ICD-10-CM

## 2023-12-31 DIAGNOSIS — F341 Dysthymic disorder: Secondary | ICD-10-CM | POA: Diagnosis not present

## 2023-12-31 DIAGNOSIS — F5089 Other specified eating disorder: Secondary | ICD-10-CM

## 2023-12-31 NOTE — Progress Notes (Signed)
 Psychotherapy Progress Note [facility] Jodie Kendall, PhD LP  Patient ID: Amy Spence)    MRN: [id number] Therapy format: Individual psychotherapy Date: 12/31/2023      Start: 3:17p     Stop: 4:05p     Time Spent: 48 min Location: In-person   Session narrative (presenting needs, interim history, self-report of stressors and symptoms, applications of prior therapy, status changes, and interventions made in session) Cathy still hasn't turned over the keys, but Pt did call the question if she's just preventing letting go for sentimental reasons (no).  Now wondering if she's procrastinating because she has a load of things packed in a building on the property (1-room former church).  Reviewed evidence, seems quite likely it is her secret reason for foot-dragging, brainstormed ways to be assertive with her including asking if that's the holdup, reminding he [name] can help move things, and being willing to honestly acknowledge yes, it is frustrating, then emphasize how [name] can get the problem off her head by going ahead and moving on it.  Medically, recent MRI found a fraying labrum and tears in her hip and hamstring.  In more aggressive chronic pain, feels she's been trending mean in reaction to it, makes her wonder about not being a good Christian.  Tells of giving a kind woman in a store in [place] a mean look and not being able to speak, later maybe recognizing her as childhood acquaintance.  Supportively confronted no, she was merely feeling backed up with her frustrations, and it will be OK.  Discussed likely procedures and treatments available for her orthopedic condition, including positive experience for a fellow patient with hamstring cleanup surgery.  In all likelihood her hip will require it, too.  Therapeutic modalities: Cognitive Behavioral Therapy, Solution-Oriented/Positive Psychology, Environmental Manager, and Faith-sensitive  Mental  Status/Observations:  Appearance:   Casual     Behavior:  Appropriate  Motor:  Normal  Speech/Language:   Clear and Coherent  Affect:  Appropriate  Mood:  dysthymic  Thought process:  normal  Thought content:    worry  Sensory/Perceptual disturbances:    WNL  Orientation:  Fully oriented  Attention:  Good    Concentration:  Fair  Memory:  WNL  Insight:    Fair  Judgment:   Good  Impulse Control:  Fair   Risk Assessment: Danger to Self: No Self-injurious Behavior: No Danger to Others: No Physical Aggression / Violence: No Duty to Warn: No Access to Firearms a concern: No  Assessment of progress:  progressing  Diagnosis:   ICD-10-CM   1. Generalized anxiety disorder with panic attacks  F41.1    F41.0     2. Dysthymia  F34.1     3. Compulsive overeating  F50.89     4. Fibromyalgia  M79.7     5. Pain of right hip  M25.551     6. Spiritual problem  Z65.8      Plan:  For boundaries and family pressures: Self-affirm OK to say her own yes or not to any time, interactions Watch own tendency to resent and release as able Try playful and inquisitive assertiveness techniques with husband's overdirection habit.  Remember, like most folks who seem to overdo control, it's probably rooted in anxiety of his own, and she can help both of them by asking after what it is he's afraid of or concerned will happen if he doesn't give directions With [name], OK to check hypotheses about what things mean to him,  support him getting to services.  OK to subsidize his living, be ready to us  the support as leverage to acknowledge blocks to recovery, including shame and transference reactions For food behavior and weight loss: OK with [facility]OL[facility] or weight clinic or another program of choice., just apply herself.  If hormonal, monitor for mood instability. Continue to practice mindful eating, moments at least of slowing down to notice food sensations Apply self to some minimum standard of cooking for  health Continue deal with Jodie to hold/hide tempting foods  Delay strategies for hunger, when it comes, water  first, movement first, anything else first To break down particularly tempting foods (e.g., chocolate), use imaginable disgust, e.g., figuring there are mice and rats in the factory, and the fact that you really can't know whether they got into the food, and you can't tell by looking... Practice emotional acknowledgment -- noticing and jotting down your feelings 3 times a day -- say as much or as little as you want about what's on your mind, but name the feelings.  Use the feeling words chart to pick a word you don't use so often. Develop menu of things can do instead of munch -- painting, Youtube, greenhouse, walk, crochet, even TV -- 15-30 minutes before making any decision to snack Still recommend daily journal of food behavior -- struggles and any partial victories, and especially affirm any time did something other than snack and it felt OK.  If logging food, be fully honest, don't leave anything out. Take own food to church dinners Carb control -- non-carbs first, reduce overall carb load, slow down and enjoy For feeling draggy, hitting the wall -- 1 tbsp MCT oil + 15 min Avoid super-low calorie targets and work for stability before trying to go lower Concur with rebalancing fat vs. protein calories Omega 3 for antiinflammatory benefit  Practice just for today perspective -- no big demands, do sobriety like the 12-step people do Education Officer, Community Anonymous for support and morale For self-esteem and mood: Practice making short work of shame -- e.g., you can have 1 minute to blame or shame, then stop and go do something that will actually improve the problem.  Remember -- the beating drives the eating. Self-affirm when taking care health or nutrition business, including doctor visits options for stress -- get hands busy, speak her feelings, vocalize/scream, clench hands 90 seconds,  various crafts (crochet, greenhouse, others) Exercise as tolerated.  Outdoor breaks, 5-10 minutes if not full walking Catch body shame/loathing and stop, don't rehearse it Medical anxiety -- Practice thinking of procedures as her opportunity to get hired help to see things she can't see for herself, are well-trained to overlook the things she considers embarrassing, and are plenty able to take on her body with the kindness, acceptance, and grace she wants and already shows other people Pain management Thoroughly assess options for torn labrum and hamstring; most likely will require surgery, referral available if desired When appropriate, stretch back and legs as tolerated for sciatic help May try Epsom salt baths for muscle tension relief for muscle and pain soothing, absorption of magnesium , and available aromatherapy Apply relaxation methods despite pain Probably need to update bedding Walk anyway, when pain allows; when it doesn't, stretch some Take up suggestion to go to a senior center chair yoga class Self-refer to rheumatology (e.g., Maya Cockayne, MD) to assess/update fibromyalgia vs. polymyalgia rheumatica vs. something else improve intake of antioxidants and antiinflammatory nutrition -- omega 3, green veg in particular, possibly biotin  and/or B complex as noted for hair benefit Endorse PT and somatic treatments beyond medication Other recommendations/advice -- As may be noted above.  Continue to utilize previously learned skills ad lib. Medication compliance -- Maintain medication as prescribed and work faithfully with relevant prescriber(s) if any changes are desired or seem indicated. Crisis service -- Aware of call list and work-in appts.  Call the clinic on-call service, 988/hotline, 911, or present to [facility] or ER if any life-threatening psychiatric crisis. Followup -- Return for time as already scheduled.  Next scheduled visit with me 01/28/2024.  Next scheduled in this office  01/28/2024.  Lamar Kendall, PhD Jodie Kendall, PhD LP Clinical Psychologist, [facility] [address] [place], [place] [address] [phone]

## 2024-01-28 ENCOUNTER — Ambulatory Visit: Payer: MEDICARE | Admitting: Psychiatry

## 2024-01-28 DIAGNOSIS — F411 Generalized anxiety disorder: Secondary | ICD-10-CM | POA: Diagnosis not present

## 2024-01-28 DIAGNOSIS — F341 Dysthymic disorder: Secondary | ICD-10-CM | POA: Diagnosis not present

## 2024-01-28 DIAGNOSIS — F5089 Other specified eating disorder: Secondary | ICD-10-CM | POA: Diagnosis not present

## 2024-01-28 DIAGNOSIS — Z638 Other specified problems related to primary support group: Secondary | ICD-10-CM

## 2024-01-28 DIAGNOSIS — F410 Panic disorder [episodic paroxysmal anxiety] without agoraphobia: Secondary | ICD-10-CM

## 2024-01-28 DIAGNOSIS — R053 Chronic cough: Secondary | ICD-10-CM

## 2024-01-28 DIAGNOSIS — M797 Fibromyalgia: Secondary | ICD-10-CM

## 2024-01-28 DIAGNOSIS — F909 Attention-deficit hyperactivity disorder, unspecified type: Secondary | ICD-10-CM

## 2024-01-28 NOTE — Progress Notes (Signed)
 Psychotherapy Progress Note [facility] Jodie Kendall, PhD LP  Patient ID: Amy Spence)    MRN: [id number] Therapy format: Individual psychotherapy Date: 01/28/2024      Start: 2:13p     Stop: 2:56p     Time Spent: 43 min Location: In-person   Session narrative (presenting needs, interim history, self-report of stressors and symptoms, applications of prior therapy, status changes, and interventions made in session) Chronic cough 8 months now, not sure what it is.  Seeing allergist next week.  It times to when she was cleaning out mother's house, raising suspicion of chronic fungal infection.  Fatiguing easily, though O2 is normal.  Gaining weight, as big as ever since pregnancy now.    S Cathy called, on the way out of last session, to say she turned in the keys, sparing the need to pursue her about it.  Problems with errors in the listing (not listing the whole lot, or the whole space) which need Haile to fix, willing to call, but not getting return.  Review of the listing seems to have everything she thought might be missing, except more accurate acreage.  Wondering why she had a strong thought that last time up there was going to be the last time seeing her friend.  Has a history of a few intuitions and premonitions, or it could be the feeling that she's completing the last duty that ties her to an area that is dying off as her home place.    Dr. Ofilia has taken her off 4 meds, she says, though that seems encompass coming off Zoloft  and Wellbutrin , now on 60mg  Cymbalta, with possible Vraylar later.  On gabapentin , Flonase , no longer on other allergy  meds listed under Dr. Kozlow.  As heard today, suspect exercise-induced hypoxia, or possibly a circulatory problem, as the cause of her easy fatigability.  Has had walking pneumonia before, and the current syndrome feels bronchial but it is a persistently dry cough.  Recommended consult her physician, making clear  about her suspected exposure.  And of course any measures she can take to stay active and manage carbs and weight without cardio exercise.  Therapeutic modalities: Cognitive Behavioral Therapy, Solution-Oriented/Positive Psychology, and Psycho-education/Bibliotherapy  Mental Status/Observations:  Appearance:   Casual     Behavior:  Appropriate  Motor:  Normal  Speech/Language:   Clear and Coherent  Affect:  Appropriate  Mood:  worrisome  Thought process:  tangential  Thought content:    worry  Sensory/Perceptual disturbances:    WNL  Orientation:  Fully oriented  Attention:  Good    Concentration:  Fair  Memory:  WNL  Insight:    Fair  Judgment:   Good  Impulse Control:  Good   Risk Assessment: Danger to Self: No Self-injurious Behavior: No Danger to Others: No Physical Aggression / Violence: No Duty to Warn: No Access to Firearms a concern: No  Assessment of progress:  stabilized  Diagnosis:   ICD-10-CM   1. Generalized anxiety disorder with panic attacks  F41.1    F41.0     2. Dysthymia  F34.1     3. Compulsive overeating  F50.89     4. Fibromyalgia  M79.7     5. Attention deficit hyperactivity disorder (ADHD), unspecified ADHD type -- by hx  F90.9     6. Relationship problem with family member (sister)  Z14.8     7. Unexplained chronic cough - persistent 8 months  R05.3    suspected  eosinophilic asthma or fungal infection post-exposure to mouse droppings     Plan:  For boundaries and family pressures: Self-affirm OK to say her own yes or not to any time, interactions Watch own tendency to resent and release as able Try playful and inquisitive assertiveness techniques with husband's overdirection habit.  Remember, like most folks who seem to overdo control, it's probably rooted in anxiety of his own, and she can help both of them by asking after what it is he's afraid of or concerned will happen if he doesn't give directions With [name], OK to check hypotheses  about what things mean to him, support him getting to services.  OK to subsidize his living, be ready to us  the support as leverage to acknowledge blocks to recovery, including shame and transference reactions For food behavior and weight loss: OK with [facility] or weight clinic or another program of choice., just apply herself.  If hormonal, monitor for mood instability. Continue to practice mindful eating, moments at least of slowing down to notice food sensations Apply self to some minimum standard of cooking for health Continue deal with Jodie to hold/hide tempting foods  Delay strategies for hunger, when it comes, water  first, movement first, anything else first To break down particularly tempting foods (e.g., chocolate), use imaginable disgust, e.g., figuring there are mice and rats in the factory, and the fact that you really can't know whether they got into the food, and you can't tell by looking... Practice emotional acknowledgment -- noticing and jotting down your feelings 3 times a day -- say as much or as little as you want about what's on your mind, but name the feelings.  Use the feeling words chart to pick a word you don't use so often. Develop menu of things can do instead of munch -- painting, Youtube, greenhouse, walk, crochet, even TV -- 15-30 minutes before making any decision to snack Still recommend daily journal of food behavior -- struggles and any partial victories, and especially affirm any time did something other than snack and it felt OK.  If logging food, be fully honest, don't leave anything out. Take own food to church dinners Carb control -- non-carbs first, reduce overall carb load, slow down and enjoy For feeling draggy, hitting the wall -- 1 tbsp MCT oil + 15 min Avoid super-low calorie targets and work for stability before trying to go lower Concur with rebalancing fat vs. protein calories Omega 3 for antiinflammatory benefit  Practice just for today perspective --  no big demands, do sobriety like the 12-step people do Education Officer, [employer] for support and morale For self-esteem and mood: Practice making short work of shame -- e.g., you can have 1 minute to blame or shame, then stop and go do something that will actually improve the problem.  Remember -- the beating drives the eating. Self-affirm when taking care health or nutrition business, including doctor visits options for stress -- get hands busy, speak her feelings, vocalize/scream, clench hands 90 seconds, various crafts (crochet, greenhouse, others) Exercise as tolerated.  Outdoor breaks, 5-10 minutes if not full walking Catch body shame/loathing and stop, don't rehearse it Medical anxiety -- Practice thinking of procedures as her opportunity to get hired help to see things she can't see for herself, are well-trained to overlook the things she considers embarrassing, and are plenty able to take on her body with the kindness, acceptance, and grace she wants and already shows other people Pain management Thoroughly assess options for torn  labrum and hamstring; most likely will require surgery, referral available if desired When appropriate, stretch back and legs as tolerated for sciatic help May try Epsom salt baths for muscle tension relief for muscle and pain soothing, absorption of magnesium , and available aromatherapy Apply relaxation methods despite pain Probably need to update bedding Walk anyway, when pain allows; when it doesn't, stretch some Take up suggestion to go to a senior center chair yoga class Self-refer to rheumatology (e.g., Maya Cockayne, MD) to assess/update fibromyalgia vs. polymyalgia rheumatica vs. something else improve intake of antioxidants and antiinflammatory nutrition -- omega 3, green veg in particular, possibly biotin and/or B complex as noted for hair benefit Endorse PT and somatic treatments beyond medication Other health Pursue allergist/pulmonologist (Dr.  Maurilio) re chronic cough and suspected bronchitis or allergic asthma related to environmental exposure at mother's home Other recommendations/advice -- As may be noted above.  Continue to utilize previously learned skills ad lib. Medication compliance -- Maintain medication as prescribed and work faithfully with relevant prescriber(s) if any changes are desired or seem indicated. Crisis service -- Aware of call list and work-in appts.  Call the clinic on-call service, 988/hotline, 911, or present to [facility] or ER if any life-threatening psychiatric crisis. Followup -- Return for time as already scheduled.  Next scheduled visit with me 03/09/2024.  Next scheduled in this office 03/09/2024.  Lamar Kendall, PhD Jodie Kendall, PhD LP Clinical Psychologist, [facility] [address], [address] [place], [place] [address] [phone]

## 2024-02-04 ENCOUNTER — Encounter: Payer: Self-pay | Admitting: Allergy and Immunology

## 2024-02-04 ENCOUNTER — Ambulatory Visit (INDEPENDENT_AMBULATORY_CARE_PROVIDER_SITE_OTHER): Payer: MEDICARE | Admitting: Allergy and Immunology

## 2024-02-04 VITALS — BP 128/84 | HR 90 | Resp 16

## 2024-02-04 DIAGNOSIS — J3089 Other allergic rhinitis: Secondary | ICD-10-CM

## 2024-02-04 DIAGNOSIS — K219 Gastro-esophageal reflux disease without esophagitis: Secondary | ICD-10-CM

## 2024-02-04 DIAGNOSIS — H04123 Dry eye syndrome of bilateral lacrimal glands: Secondary | ICD-10-CM

## 2024-02-04 MED ORDER — FAMOTIDINE 40 MG PO TABS: ORAL_TABLET | ORAL | 5 refills | Status: AC

## 2024-02-04 MED ORDER — OMEPRAZOLE 40 MG PO CPDR: DELAYED_RELEASE_CAPSULE | ORAL | 5 refills | Status: AC

## 2024-02-04 NOTE — Patient Instructions (Addendum)
  1. Treat and prevent reflux induced airway inflammation:   A. Omeprazole  40 mg - 2 times per day  B. Famotidine  40 mg - 1 tablet 1 time per day  C. Minimize caffeine and chocolate consumption  D. Replace throat clearing with swallowing / drinking maneuver  2. If needed:   A. Systane eye drops multiple times per day  B. Pataday - 1 drop each eye 1 time per day  C. Nasal saline multiple times per day  D. OTC mucinex DM  E. Jolly Ranchers  3. Return to clinic in 4 weeks or earlier if problem  4. Influenza = Tamiflu. Covid = paxlovid

## 2024-02-04 NOTE — Progress Notes (Signed)
 [place] - [place] - [place] - [place] - [place]   Follow-up Note  Referring Provider: Ofilia Lamar CROME, MD Primary Provider: Ofilia Lamar CROME, MD Date of Office Visit: 02/04/2024  Subjective:   Amy Spence (DOB: 1955-09-03) is a 68 y.o. female who [facility] in re-evaluation of the following:  HPI: Kyli presents to this clinic in evaluation of coughing.  I last saw her in this clinic for chronic rhinitis, dry eye syndrome, LPR, chronic daily headache.  Her last visit was 24 Jul 2023.  She thinks that she has been coughing now since the summer continuously.  She has unrelenting cough and it is disturbing her sleep.  She does not really have any significant upper airway symptoms.  She does not have any chest tightness or shortness of breath.  She apparently visited with ENT who told her that her exam was okay.  She is definitely having issues with reflux.  She is having throat burning from her reflux.  She was taken off her omeprazole  and given famotidine  recently.  Apparently she had walking pneumonia sometime a few months ago and required an antibiotic and steroid.  She has a [employer] caffeinated drink most days of the week and she has chocolate daily.  Allergies as of 02/04/2024       Reactions   Nitrofurantoin Itching   Latex Rash   Minor irriation        Medication List    atorvastatin  20 MG tablet Commonly known as: LIPITOR Take 20 mg by mouth daily.   B-12 5000 MCG Subl Place 5,000 mcg under the tongue daily.   diclofenac 75 MG EC tablet Commonly known as: VOLTAREN Take 75 mg by mouth 2 (two) times daily.   docusate sodium  100 MG capsule Commonly known as: COLACE Take 100 mg by mouth every 8 (eight) hours as needed for mild constipation.   DULoxetine 60 MG capsule Commonly known as: CYMBALTA Take 60 mg by mouth.   estradiol 0.1 MG/GM vaginal cream Commonly known as: ESTRACE   famotidine  20  MG tablet Commonly known as: PEPCID  Take 20 mg by mouth 4 (four) times daily.   fluticasone  50 MCG/ACT nasal spray Commonly known as: FLONASE  Place 1 spray into both nostrils daily.   gabapentin  100 MG capsule Commonly known as: NEURONTIN    glycerin adult 2 g suppository Place 1 suppository rectally daily as needed for constipation.   HAIR SKIN & NAILS PO Take by mouth daily.   hydrOXYzine  10 MG tablet Commonly known as: ATARAX  Take 10 mg by mouth 3 (three) times daily as needed for anxiety.   lidocaine  5 % Commonly known as: LIDODERM  Apply patch to painful area. Patch may remain in place for up to 12 hours in a 24 hour period.   lisdexamfetamine 50 MG capsule Commonly known as: VYVANSE  Take by mouth.   MULTIVITAMIN PO Take by mouth daily.   Vitamin D 50 MCG (2000 UT) tablet Take 2,000 Units by mouth daily.   Xiidra 5 % Soln Generic drug: Lifitegrast Apply to eye.    Past Medical History:  Diagnosis Date   Allergic rhinitis    Anemia    Anxiety    Arthritis    DDD (degenerative disc disease), lumbar    Depression    Erosive gastritis    Fibromyalgia    GERD (gastroesophageal reflux disease)    Pneumonia     Past Surgical History:  Procedure Laterality Date  BREAST SURGERY     augmentation   EYE SURGERY     eye lid   KNEE ARTHROSCOPY     REVERSE SHOULDER ARTHROPLASTY Right 12/03/2018   Procedure: REVERSE SHOULDER ARTHROPLASTY;  Surgeon: Melita Drivers, MD;  Location: [facility];  Service: Orthopedics;  Laterality: Right;    TONSILLECTOMY     TOTAL KNEE ARTHROPLASTY Right 05/16/2020   Procedure: TOTAL KNEE ARTHROPLASTY;  Surgeon: Ernie Cough, MD;  Location: [facility];  Service: Orthopedics;  Laterality: Right;  70 mins   TUBAL LIGATION      Review of systems negative except as noted in HPI / PMHx or noted below:  Review of Systems  Constitutional: Negative.   HENT: Negative.    Eyes: Negative.   Respiratory: Negative.    Cardiovascular:  Negative.   Gastrointestinal: Negative.   Genitourinary: Negative.   Musculoskeletal: Negative.   Skin: Negative.   Neurological: Negative.   Endo/Heme/Allergies: Negative.   Psychiatric/Behavioral: Negative.       Objective:   Vitals:   02/04/24 1503  BP: 128/84  Pulse: 90  Resp: 16  SpO2: 97%          Physical Exam Constitutional:      Appearance: She is not diaphoretic.     Comments: coughing  HENT:     Head: Normocephalic.     Right Ear: Tympanic membrane, ear canal and external ear normal.     Left Ear: Tympanic membrane, ear canal and external ear normal.     Nose: Nose normal. No mucosal edema or rhinorrhea.     Mouth/Throat:     Pharynx: Uvula midline. No oropharyngeal exudate.  Eyes:     Conjunctiva/sclera: Conjunctivae normal.  Neck:     Thyroid : No thyromegaly.     Trachea: Trachea normal. No tracheal tenderness or tracheal deviation.  Cardiovascular:     Rate and Rhythm: Normal rate and regular rhythm.     Heart sounds: Normal heart sounds, S1 normal and S2 normal. No murmur heard. Pulmonary:     Effort: No respiratory distress.     Breath sounds: Normal breath sounds. No stridor. No wheezing or rales.  Lymphadenopathy:     Head:     Right side of head: No tonsillar adenopathy.     Left side of head: No tonsillar adenopathy.     Cervical: No cervical adenopathy.  Skin:    Findings: No erythema or rash.     Nails: There is no clubbing.  Neurological:     Mental Status: She is alert.     Diagnostics: Spirometry was performed and demonstrated an FEV1 of 2.49 at 113 % of predicted.  Results of a chest x-ray obtained 24 December 2023 identified the following:  Cardiovascular: Cardiac silhouette and pulmonary vasculature are within normal limits.  Mediastinum: Within normal limits.  Lungs/pleura: Clear. No pneumothorax or pleural effusion.    Assessment and Plan:   1. LPRD (laryngopharyngeal reflux disease)   2. Dry eye syndrome of both eyes    3. Perennial allergic rhinitis    1. Treat and prevent reflux induced airway inflammation:   A. Omeprazole  40 mg - 2 times per day  B. Famotidine  40 mg - 1 tablet 1 time per day  C. Minimize caffeine and chocolate consumption  D. Replace throat clearing with swallowing / drinking maneuver  2. If needed:   A. Systane eye drops multiple times per day  B. Pataday - 1 drop each eye 1 time per day  C.  Nasal saline multiple times per day  D. OTC mucinex DM  E. Jolly Ranchers  3. Return to clinic in 4 weeks or earlier if problem  4. Influenza = Tamiflu. Covid = paxlovid  It seems that Rekia has had a rather significant flare of her LPR most likely secondary to loss of her control from reflux as a result of eliminating her proton pump inhibitor.  I am going to treat her aggressively for this condition as noted above we will see what happens over the course of the next several weeks.  Camellia Denis, MD Allergy  / Immunology [facility]

## 2024-02-09 ENCOUNTER — Encounter: Payer: Self-pay | Admitting: Allergy and Immunology

## 2024-03-09 ENCOUNTER — Ambulatory Visit (INDEPENDENT_AMBULATORY_CARE_PROVIDER_SITE_OTHER): Payer: MEDICARE | Admitting: Psychiatry

## 2024-03-09 DIAGNOSIS — F341 Dysthymic disorder: Secondary | ICD-10-CM | POA: Diagnosis not present

## 2024-03-09 DIAGNOSIS — F410 Panic disorder [episodic paroxysmal anxiety] without agoraphobia: Secondary | ICD-10-CM

## 2024-03-09 DIAGNOSIS — F909 Attention-deficit hyperactivity disorder, unspecified type: Secondary | ICD-10-CM

## 2024-03-09 DIAGNOSIS — M797 Fibromyalgia: Secondary | ICD-10-CM

## 2024-03-09 DIAGNOSIS — F5089 Other specified eating disorder: Secondary | ICD-10-CM | POA: Diagnosis not present

## 2024-03-09 DIAGNOSIS — G479 Sleep disorder, unspecified: Secondary | ICD-10-CM | POA: Diagnosis not present

## 2024-03-09 DIAGNOSIS — F411 Generalized anxiety disorder: Secondary | ICD-10-CM | POA: Diagnosis not present

## 2024-03-09 NOTE — Progress Notes (Unsigned)
 Psychotherapy Progress Note [facility] Jodie Kendall, PhD LP  Patient ID: Amy Spence)    MRN: [id number] Therapy format: Individual psychotherapy Date: 03/09/2024      Start: 2:10p     Stop: 3:00p     Time Spent: 50 min Location: In-person   Session narrative (presenting needs, interim history, self-report of stressors and symptoms, applications of prior therapy, status changes, and interventions made in session) Doing OK, just painful in the cold weather (fibro).  Working at getting herself out of the house when weather permits.  Convinced now she needs vitamin D3, which she is on.  PT Dr. Bill working out well.  Having more intense burning in left wrist, which is bone on bone needs artificial cartilage.  Looking forward to dietician consultation to learn better about antiinflammatory.  Knows she is steroid-sensitive, and upcoming SI joint injections.  Sees pain and immobility as her primary depressors, realistically.  Will be getting a sleep study, but seems to be held up in red tape from 4 weeks ago.  (Provided number to call and inquire.)  Says she tends to stay in bed till noon, most likely for degraded sleep, and family will get on her case about staying in bed  Wants to be more in charge of worry this year  Sees neurology tomorrow ([facility]) about troubling times of wordfinding and concern for Alzheimer's, which has shown up in her ancestry.  Supportively confronted that her chronic pain and sleep degradation, combined with her ability to keep up with the conversation, really suggests much more that she is dealing with rising and falling sleep and stress effects, but certainly welcome to seek opinion.  Possible that she is eligible for the Alzheimer's blood test   Therapeutic modalities: {AM:23362::Cognitive Behavioral Therapy,Solution-Oriented/Positive Psychology}  Mental Status/Observations:  Appearance:   {PSY:22683}     Behavior:   {PSY:21022743}  Motor:  {PSY:22302}  Speech/Language:   {PSY:22685}  Affect:  {PSY:22687}  Mood:  {PSY:31886}  Thought process:  {PSY:31888}  Thought content:    {PSY:[id number]}  Sensory/Perceptual disturbances:    {PSY:339-443-4354}  Orientation:  {Psych Orientation:23301::Fully oriented}  Attention:  {Good-Fair-Poor ratings:23770::Good}    Concentration:  {Good-Fair-Poor ratings:23770::Good}  Memory:  {PSY:352-791-6653}  Insight:    {Good-Fair-Poor ratings:23770::Good}  Judgment:   {Good-Fair-Poor ratings:23770::Good}  Impulse Control:  {Good-Fair-Poor ratings:23770::Good}   Risk Assessment: Danger to Self: {Risk:22599::No} Self-injurious Behavior: {Risk:22599::No} Danger to Others: {Risk:22599::No} Physical Aggression / Violence: {Risk:22599::No} Duty to Warn: {AMYesNo:22526::No} Access to Firearms a concern: {AMYesNo:22526::No}  Assessment of progress:  {Progress:22147::progressing}  Diagnosis: No diagnosis found. Plan:  *** Other recommendations/advice -- As may be noted above.  Continue to utilize previously learned skills ad lib. Medication compliance -- Maintain medication as prescribed and work faithfully with relevant prescriber(s) if any changes are desired or seem indicated. Crisis service -- Aware of call list and work-in appts.  Call the clinic on-call service, 988/hotline, 911, or present to [facility] or ER if any life-threatening psychiatric crisis. Followup -- No follow-ups on file.  Next scheduled visit with me 04/01/2024.  Next scheduled in this office 04/01/2024.  Lamar Kendall, PhD Jodie Kendall, PhD LP Clinical Psychologist, [facility] [address] [place], [place] [address] [phone]

## 2024-03-10 ENCOUNTER — Encounter: Payer: Self-pay | Admitting: Allergy and Immunology

## 2024-03-10 ENCOUNTER — Ambulatory Visit (INDEPENDENT_AMBULATORY_CARE_PROVIDER_SITE_OTHER): Payer: MEDICARE | Admitting: Allergy and Immunology

## 2024-03-10 VITALS — BP 128/76 | HR 88 | Temp 97.7°F | Resp 20 | Ht 62.0 in | Wt 229.2 lb

## 2024-03-10 DIAGNOSIS — H04123 Dry eye syndrome of bilateral lacrimal glands: Secondary | ICD-10-CM

## 2024-03-10 DIAGNOSIS — J3089 Other allergic rhinitis: Secondary | ICD-10-CM

## 2024-03-10 DIAGNOSIS — K219 Gastro-esophageal reflux disease without esophagitis: Secondary | ICD-10-CM

## 2024-03-10 NOTE — Progress Notes (Signed)
 "  [place] [facility] [place] [facility] [place] [facility] [place] [facility] [place]   Follow-up Note  Referring Provider: Ofilia Lamar CROME, MD Primary Provider: Ofilia Lamar CROME, MD Date of Office Visit: 03/10/2024  Subjective:   Amy Spence (DOB: Jan 20, 1956) is a 68 y.o. female who [facility] in re-evaluation of the following:  HPI: Amy Spence presents to this clinic in evaluation of LPR, dry eye syndrome, allergic rhinitis.  I last saw her in this clinic 04 February 2024.  When we last saw her in this clinic she was having a pretty big flare of her LPR and we aggressively treat her for this condition and she has decreased her caffeine consumption by at least 50% and continues on omeprazole  and famotidine  and she is much better.  She has much less throat clearing and she has much less cough and overall very pleased with the response that she has received.  She has had no issues with her upper airway.  She visited with neurology this morning and she is going to have a sleep study to assess her deficit of sleep and memory issues.  She only receives about 4 hours of sleep per night.  Allergies as of 03/10/2024       Reactions   Nitrofurantoin Itching   Latex Rash   Minor irriation        Medication List    atorvastatin  20 MG tablet Commonly known as: LIPITOR Take 20 mg by mouth daily.   B-12 5000 MCG Subl Place 5,000 mcg under the tongue daily.   cephALEXin 250 MG capsule Commonly known as: KEFLEX Take 250 mg by mouth daily.   diclofenac 75 MG EC tablet Commonly known as: VOLTAREN Take 75 mg by mouth 2 (two) times daily.   docusate sodium  100 MG capsule Commonly known as: COLACE Take 100 mg by mouth every 8 (eight) hours as needed for mild constipation.   estradiol 0.1 MG/GM vaginal cream Commonly known as: ESTRACE   famotidine  40 MG tablet Commonly known as: PEPCID  Take one tablet once daily   fluticasone  50 MCG/ACT nasal spray Commonly  known as: FLONASE  Place 1 spray into both nostrils daily.   gabapentin  100 MG capsule Commonly known as: NEURONTIN  What changed: See the new instructions.   glycerin adult 2 g suppository Place 1 suppository rectally daily as needed for constipation.   HAIR SKIN & NAILS PO Take by mouth daily.   hydrOXYzine  10 MG tablet Commonly known as: ATARAX  Take 10 mg by mouth 3 (three) times daily as needed for anxiety.   lidocaine  5 % Commonly known as: LIDODERM  Apply patch to painful area. Patch may remain in place for up to 12 hours in a 24 hour period.   MULTIVITAMIN PO Take by mouth daily.   omeprazole  40 MG capsule Commonly known as: PRILOSEC Take one capsule twice daily as directed   Vitamin D 50 MCG (2000 UT) tablet Take 2,000 Units by mouth daily.   Xiidra 5 % Soln Generic drug: Lifitegrast Apply to eye.    Past Medical History:  Diagnosis Date   Allergic rhinitis    Anemia    Anxiety    Arthritis    DDD (degenerative disc disease), lumbar    Depression    Erosive gastritis    Fibromyalgia    GERD (gastroesophageal reflux disease)    Pneumonia     Past Surgical History:  Procedure Laterality Date   BREAST SURGERY  augmentation   EYE SURGERY     eye lid   KNEE ARTHROSCOPY     REVERSE SHOULDER ARTHROPLASTY Right 12/03/2018   Procedure: REVERSE SHOULDER ARTHROPLASTY;  Surgeon: Melita Drivers, MD;  Location: [facility];  Service: Orthopedics;  Laterality: Right;    TONSILLECTOMY     TOTAL KNEE ARTHROPLASTY Right 05/16/2020   Procedure: TOTAL KNEE ARTHROPLASTY;  Surgeon: Ernie Cough, MD;  Location: [facility];  Service: Orthopedics;  Laterality: Right;  70 mins   TUBAL LIGATION      Review of systems negative except as noted in HPI / PMHx or noted below:  Review of Systems  Constitutional: Negative.   HENT: Negative.    Eyes: Negative.   Respiratory: Negative.    Cardiovascular: Negative.   Gastrointestinal: Negative.   Genitourinary: Negative.    Musculoskeletal: Negative.   Skin: Negative.   Neurological: Negative.   Endo/Heme/Allergies: Negative.   Psychiatric/Behavioral: Negative.       Objective:   Vitals:   03/10/24 1508  BP: 128/76  Pulse: 88  Resp: 20  Temp: 97.7 F (36.5 C)  SpO2: 97%   Height: 5' 2 (157.5 cm)  Weight: 229 lb 3.2 oz (104 kg)   Physical Exam Constitutional:      Appearance: She is not diaphoretic.  HENT:     Head: Normocephalic.     Right Ear: Tympanic membrane, ear canal and external ear normal.     Left Ear: Tympanic membrane, ear canal and external ear normal.     Nose: Nose normal. No mucosal edema or rhinorrhea.     Mouth/Throat:     Pharynx: Uvula midline. No oropharyngeal exudate.  Eyes:     Conjunctiva/sclera: Conjunctivae normal.  Neck:     Thyroid : No thyromegaly.     Trachea: Trachea normal. No tracheal tenderness or tracheal deviation.  Cardiovascular:     Rate and Rhythm: Normal rate and regular rhythm.     Heart sounds: Normal heart sounds, S1 normal and S2 normal. No murmur heard. Pulmonary:     Effort: No respiratory distress.     Breath sounds: Normal breath sounds. No stridor. No wheezing or rales.  Lymphadenopathy:     Head:     Right side of head: No tonsillar adenopathy.     Left side of head: No tonsillar adenopathy.     Cervical: No cervical adenopathy.  Skin:    Findings: No erythema or rash.     Nails: There is no clubbing.  Neurological:     Mental Status: She is alert.     Diagnostics: none  Assessment and Plan:   1. LPRD (laryngopharyngeal reflux disease)   2. Perennial allergic rhinitis   3. Dry eye syndrome of both eyes    1. Treat and prevent reflux induced airway inflammation:   A. Omeprazole  40 mg - 2 times per day  B. Famotidine  40 mg - 1 tablet 1 time per day  C. Minimize caffeine and chocolate consumption  D. Replace throat clearing with swallowing / drinking maneuver  2. If needed:   A. Systane eye drops multiple times per  day  B. Pataday - 1 drop each eye 1 time per day  C. Nasal saline multiple times per day  D. OTC mucinex DM  E. Jolly Ranchers  3. Return to clinic in 12 weeks or earlier if problem. Taper medications???  4. Influenza = Tamiflu. Covid = paxlovid  Amy Spence appears to be doing much better regarding her LPR on her  current plan and she will maintain this therapy and she has a selection of other agents to be utilized should they be needed as noted above.  I will see her back in this clinic in 12 weeks and or may be an opportunity to consolidate her treatment at that point.  Camellia Denis, MD Allergy  / Immunology [facility] "

## 2024-03-10 NOTE — Patient Instructions (Addendum)
" °  1. Treat and prevent reflux induced airway inflammation:   A. Omeprazole  40 mg - 2 times per day  B. Famotidine  40 mg - 1 tablet 1 time per day  C. Minimize caffeine and chocolate consumption  D. Replace throat clearing with swallowing / drinking maneuver  2. If needed:   A. Systane eye drops multiple times per day  B. Pataday - 1 drop each eye 1 time per day  C. Nasal saline multiple times per day  D. OTC mucinex DM  E. Jolly Ranchers  3. Return to clinic in 12 weeks or earlier if problem. Taper medications???  4. Influenza = Tamiflu. Covid = paxlovid "

## 2024-03-15 ENCOUNTER — Encounter: Payer: Self-pay | Admitting: Allergy and Immunology

## 2024-03-18 ENCOUNTER — Telehealth: Payer: Self-pay | Admitting: Allergy and Immunology

## 2024-03-18 NOTE — Telephone Encounter (Signed)
Patient informed of Dr. Kozlow's message. °

## 2024-03-18 NOTE — Telephone Encounter (Signed)
 Patient states Dr. Kozlow told her to give up caffeine due to her reflux. She LOVES her coffee and has been drinking decaf. She wants to know if this is OK or if it is still bad for her.

## 2024-03-18 NOTE — Telephone Encounter (Signed)
 Please advise if decaf coffee is okay.

## 2024-04-01 ENCOUNTER — Ambulatory Visit: Payer: MEDICARE | Admitting: Psychiatry

## 2024-04-01 DIAGNOSIS — F909 Attention-deficit hyperactivity disorder, unspecified type: Secondary | ICD-10-CM

## 2024-04-01 DIAGNOSIS — F5089 Other specified eating disorder: Secondary | ICD-10-CM | POA: Diagnosis not present

## 2024-04-01 DIAGNOSIS — M797 Fibromyalgia: Secondary | ICD-10-CM

## 2024-04-01 DIAGNOSIS — F410 Panic disorder [episodic paroxysmal anxiety] without agoraphobia: Secondary | ICD-10-CM | POA: Diagnosis not present

## 2024-04-01 DIAGNOSIS — G479 Sleep disorder, unspecified: Secondary | ICD-10-CM

## 2024-04-01 DIAGNOSIS — F411 Generalized anxiety disorder: Secondary | ICD-10-CM

## 2024-04-01 NOTE — Progress Notes (Signed)
 Psychotherapy Progress Note [facility] Jodie Kendall, PhD LP  Patient ID: Amy Spence)    MRN: [id number] Therapy format: Individual psychotherapy Date: 04/01/2024      Start: 2:08p     Stop: 2:53p     Time Spent: 45 min Location: In-person   Session narrative (presenting needs, interim history, self-report of stressors and symptoms, applications of prior therapy, status changes, and interventions made in session) MRI last week in [facility], following neuro visit 12/31.  Blood work unremarkable but MOCA 20/30, with 7 words and clock tasks showing deficits.  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  Assessment mild/mod sleep apnea the primary finding, amyloid normal, TBD about other underlying process.    In person, says she hasn't gotten results, either.  Wonders if the right front hyperintensity would cause her word finding problems -- potentially, but degraded sleep should have just as much to do with it.  MOCA results, knows she had problems with word memory and clock drawing, but those could also be partly her preexisting ADHD manifesting.  Sleep study coming up, but doesn't want to go back through lengthy neuropsych testing she had a few years ago.  Unlikely she'd have to.    Orthopedist doing a nerve ablation in her lumber spine 2/24.  Has 5 levels of bone-to-bone fusion there.  Other joints degenerated in knees and hands.  Knows prolonged stresses and comfort eating drove the problems, now committed to giving it to God and taking best care of herself.    Looking forward to a trip to [place], [place] in late April, but apprehensive about costume (gangster wives theme).  Weight management has put her on 1500 calories, but she feels like the approach is too mild; advised ask her specialist, and probably an issue with being too aggressive triggering her yo-yo response.    Admits late in session she feels guilty about offending some  people.  In particular, the former neighbor child who ran into her and suffered a dirty look from [place] when she was having a frustrating word finding episode.  Discussed what she might do to get in touch with her and modeled how she might frame it succinctly if she wants to relieve her conscience of the unintended offense.  Therapeutic modalities: Cognitive Behavioral Therapy, Solution-Oriented/Positive Psychology, and Ego-Supportive  Mental Status/Observations:  Appearance:   Casual     Behavior:  Appropriate  Motor:  Normal  Speech/Language:   Clear and Coherent  Affect:  Appropriate  Mood:  Some anxiety  Thought process:  Some flight  Thought content:    worry  Sensory/Perceptual disturbances:    WNL  Orientation:  Fully oriented  Attention:  Good    Concentration:  Fair  Memory:  WNL  Insight:    Good  Judgment:   Good  Impulse Control:  Variable   Risk Assessment: Danger to Self: No Self-injurious Behavior: No Danger to Others: No Physical Aggression / Violence: No Duty to Warn: No Access to Firearms a concern: No  Assessment of progress:  progressing  Diagnosis:   ICD-10-CM   1. Generalized anxiety disorder with panic attacks  F41.1    F41.0     2. Attention deficit hyperactivity disorder (ADHD), unspecified ADHD type -- by hx  F90.9     3. Compulsive overeating  F50.89     4. Fibromyalgia  M79.7     5. Difficulty sleeping -- r/o OSA  G47.9  Plan:  For boundaries and family pressures: Self-affirm OK to say her own yes or not to any time, interactions Watch own tendency to resent and release as able Try playful and inquisitive assertiveness techniques with husband's overdirection habit.  Remember, like most folks who seem to overdo control, it's probably rooted in anxiety of his own, and she can help both of them by asking after what it is he's afraid of or concerned will happen if he doesn't give directions With [name], OK to check hypotheses about  what things mean to him, support him getting to services.  OK to subsidize his living, be ready to us  the support as leverage to acknowledge blocks to recovery, including shame and transference reactions For food behavior and weight loss: OK with [facility] or weight clinic or another program of choice., just apply herself.  If hormonal, monitor for mood instability. Continue to practice mindful eating, moments at least of slowing down to notice food sensations Apply self to some minimum standard of cooking for health Continue deal with Jodie to hold/hide tempting foods  Delay strategies for hunger, when it comes, water  first, movement first, anything else first To break down particularly tempting foods (e.g., chocolate), use imaginable disgust, e.g., figuring there are mice and rats in the factory, and the fact that you really can't know whether they got into the food, and you can't tell by looking... Practice emotional acknowledgment -- noticing and jotting down your feelings 3 times a day -- say as much or as little as you want about what's on your mind, but name the feelings.  Use the feeling words chart to pick a word you don't use so often. Develop menu of things can do instead of munch -- painting, Youtube, greenhouse, walk, crochet, even TV -- 15-30 minutes before making any decision to snack Still recommend daily journal of food behavior -- struggles and any partial victories, and especially affirm any time did something other than snack and it felt OK.  If logging food, be fully honest, don't leave anything out. Take own food to church dinners Carb control -- non-carbs first, reduce overall carb load, slow down and enjoy For feeling draggy, hitting the wall -- 1 tbsp MCT oil + 15 min Avoid super-low calorie targets and work for stability before trying to go lower Concur with rebalancing fat vs. protein calories Omega 3 for antiinflammatory benefit  Practice just for today perspective -- no  big demands, do sobriety like the 12-step people do Education Officer, [employer] for support and morale For self-esteem and mood: Practice making short work of shame -- e.g., you can have 1 minute to blame or shame, then stop and go do something that will actually improve the problem.  Remember -- the beating drives the eating. Self-affirm when taking care health or nutrition business, including doctor visits options for stress -- get hands busy, speak her feelings, vocalize/scream, clench hands 90 seconds, various crafts (crochet, greenhouse, others) Exercise as tolerated.  Outdoor breaks, 5-10 minutes if not full walking Catch body shame/loathing and stop, don't rehearse it Medical anxiety -- Practice thinking of procedures as her opportunity to get hired help to see things she can't see for herself, are well-trained to overlook the things she considers embarrassing, and are plenty able to take on her body with the kindness, acceptance, and grace she wants and already shows other people Pain management Thoroughly assess options for torn labrum and hamstring; most likely will require surgery, referral available if desired When  appropriate, stretch back and legs as tolerated for sciatic help May try Epsom salt baths for muscle tension relief for muscle and pain soothing, absorption of magnesium , and available aromatherapy Apply relaxation methods despite pain Probably need to update bedding Walk anyway, when pain allows; when it doesn't, stretch some Take up suggestion to go to a senior center chair yoga class Self-refer to rheumatology (e.g., Maya Cockayne, MD) to assess/update fibromyalgia vs. polymyalgia rheumatica vs. something else improve intake of antioxidants and antiinflammatory nutrition -- omega 3, green veg in particular, possibly biotin and/or B complex as noted for hair benefit Endorse PT and somatic treatments beyond medication Other health Pursue allergist/pulmonologist (Dr.  Maurilio) re chronic cough and suspected bronchitis or allergic asthma related to environmental exposure at mother's home OK with neurology eval, most likely will find stress/sleep effects and should sort out apnea and breathing issues before concluding anything else Other recommendations/advice -- As may be noted above.  Continue to utilize previously learned skills ad lib. Medication compliance -- Maintain medication as prescribed and work faithfully with relevant prescriber(s) if any changes are desired or seem indicated. Crisis service -- Aware of call list and work-in appts.  Call the clinic on-call service, 988/hotline, 911, or present to [facility] or ER if any life-threatening psychiatric crisis. Followup -- Return for time as already scheduled.  Next scheduled visit with me 04/27/2024.  Next scheduled in this office 04/27/2024.  Lamar Kendall, PhD Jodie Kendall, PhD LP Clinical Psychologist, [facility] [address] [place], [place] [address] [phone]

## 2024-04-27 ENCOUNTER — Ambulatory Visit: Payer: MEDICARE | Admitting: Psychiatry

## 2024-05-24 ENCOUNTER — Ambulatory Visit: Payer: MEDICARE | Admitting: Psychiatry

## 2024-06-03 ENCOUNTER — Ambulatory Visit: Payer: MEDICARE | Admitting: Allergy and Immunology

## 2024-06-24 ENCOUNTER — Ambulatory Visit: Payer: MEDICARE | Admitting: Psychiatry
